# Patient Record
Sex: Male | Born: 1950
Health system: Southern US, Community
[De-identification: ages and names within clinical notes are randomized; demographics above are authoritative.]

## PROBLEM LIST (undated history)

## (undated) DIAGNOSIS — C801 Malignant (primary) neoplasm, unspecified: Secondary | ICD-10-CM

## (undated) DIAGNOSIS — M1990 Unspecified osteoarthritis, unspecified site: Secondary | ICD-10-CM

## (undated) HISTORY — PX: JOINT REPLACEMENT: SHX530

## (undated) HISTORY — DX: Unspecified osteoarthritis, unspecified site: M19.90

---

## 1997-06-26 ENCOUNTER — Encounter: Admission: RE | Admit: 1997-06-26 | Discharge: 1997-06-26 | Payer: Self-pay | Admitting: Sports Medicine

## 2001-08-27 ENCOUNTER — Encounter: Admission: RE | Admit: 2001-08-27 | Discharge: 2001-08-27 | Payer: Self-pay | Admitting: Sports Medicine

## 2001-11-12 ENCOUNTER — Encounter: Admission: RE | Admit: 2001-11-12 | Discharge: 2001-11-12 | Payer: Self-pay | Admitting: Sports Medicine

## 2004-11-21 ENCOUNTER — Ambulatory Visit: Payer: Self-pay | Admitting: Internal Medicine

## 2004-11-24 ENCOUNTER — Ambulatory Visit: Payer: Self-pay | Admitting: Internal Medicine

## 2004-12-26 ENCOUNTER — Ambulatory Visit: Payer: Self-pay | Admitting: Internal Medicine

## 2005-08-16 ENCOUNTER — Ambulatory Visit (HOSPITAL_COMMUNITY): Admission: RE | Admit: 2005-08-16 | Discharge: 2005-08-16 | Payer: Self-pay | Admitting: Orthopaedic Surgery

## 2005-08-18 ENCOUNTER — Encounter: Admission: RE | Admit: 2005-08-18 | Discharge: 2005-08-18 | Payer: Self-pay | Admitting: Orthopaedic Surgery

## 2005-09-08 ENCOUNTER — Ambulatory Visit: Payer: Self-pay | Admitting: Family Medicine

## 2005-09-19 ENCOUNTER — Ambulatory Visit: Payer: Self-pay | Admitting: Sports Medicine

## 2005-10-17 ENCOUNTER — Ambulatory Visit: Payer: Self-pay | Admitting: Sports Medicine

## 2005-11-21 ENCOUNTER — Ambulatory Visit: Payer: Self-pay | Admitting: Gastroenterology

## 2005-11-28 ENCOUNTER — Ambulatory Visit: Payer: Self-pay | Admitting: Sports Medicine

## 2005-11-29 ENCOUNTER — Ambulatory Visit: Payer: Self-pay | Admitting: Gastroenterology

## 2008-05-09 IMAGING — MR MR LUMBAR SPINE W/O CM
4 of 8 series · 19 of 48 positions shown · IV contrast (agent unspecified)
Comparison: There is 5 mm of anterior slip of L3 on L4.

CLINICAL DATA: Low back pain, right lumbar spasms.  
MRI LUMBAR SPINE WITHOUT CONTRAST:
TECHNIQUE: Multiplanar and multiecho pulse sequences of the lumbar spine, to include the lower thoracic and upper sacral regions, were obtained according to standard protocol without IV contrast.

[Series 3: T2 · sagittal · 4.0mm · 0.51mm/px · 4 of 12 slices shown (1 of 2)]
[im 1/12]
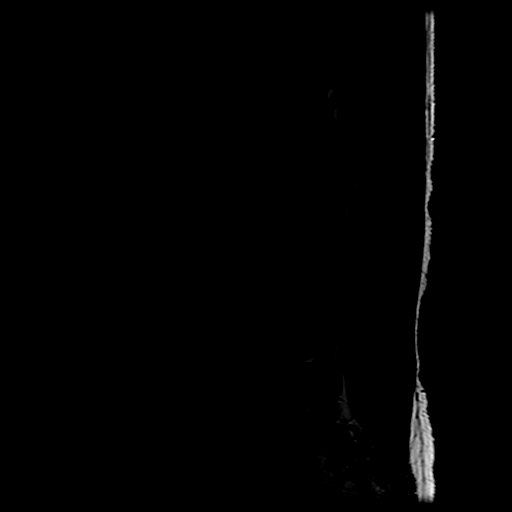
[im 4/12]
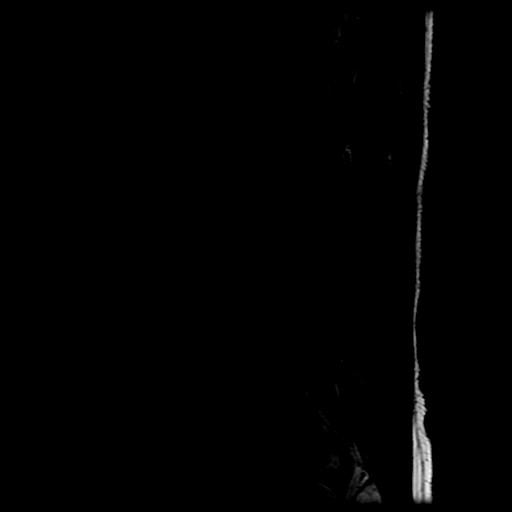
[im 8/12]
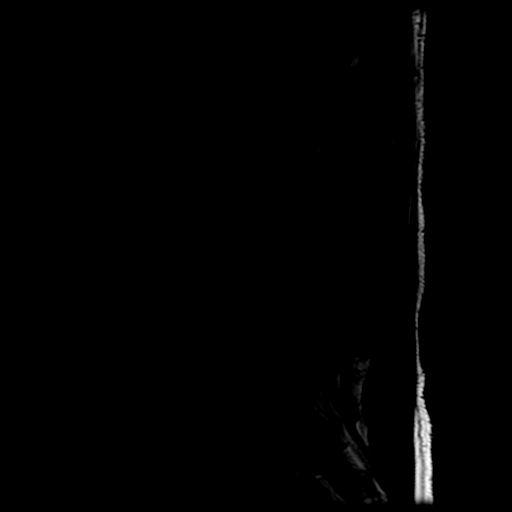
[im 12/12]
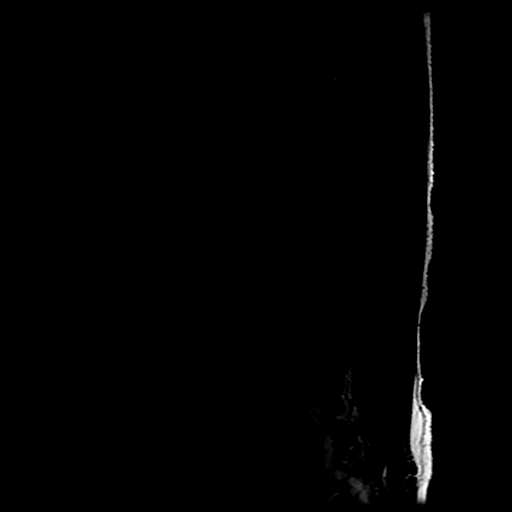

[Series 4: T1 · sagittal · 4.0mm · 0.51mm/px · 2 of 6 slices shown (1 of 2)]
[im 1/6]
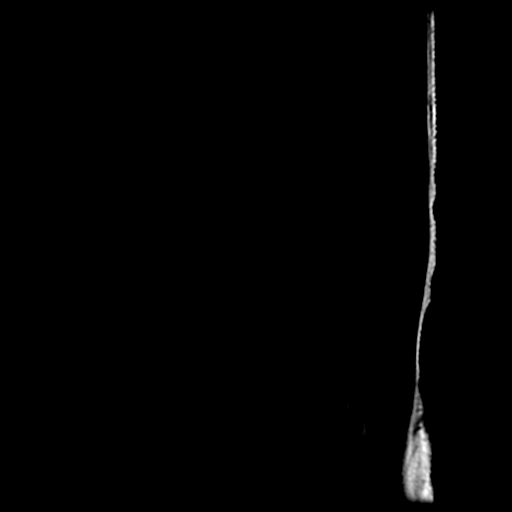
[im 6/6]
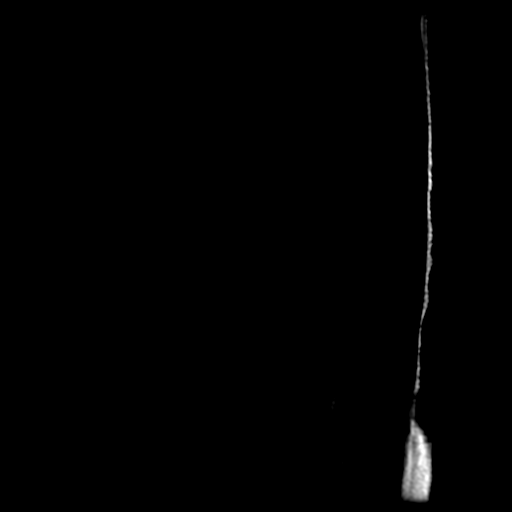

[Series 5: T1 · sagittal · 4.0mm · 0.51mm/px · 4 of 12 slices shown (2 of 2)]
[im 1/12]
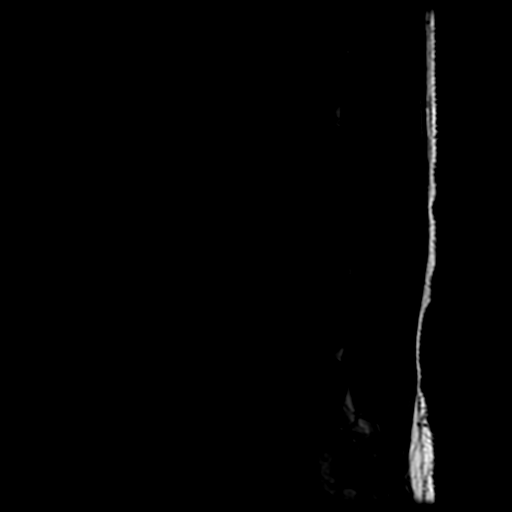
[im 4/12]
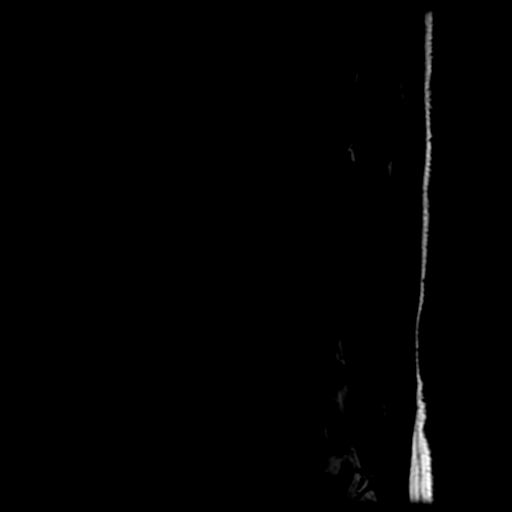
[im 8/12]
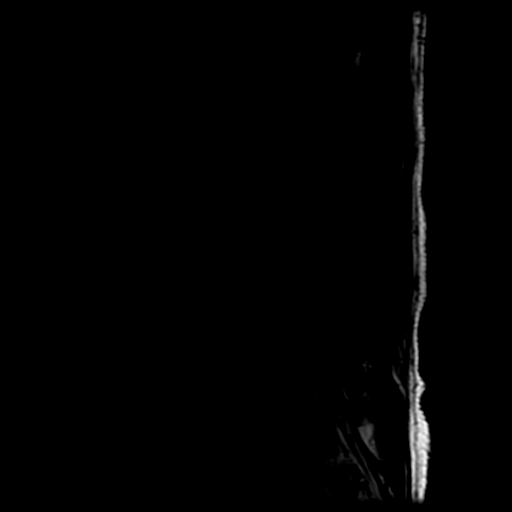
[im 12/12]
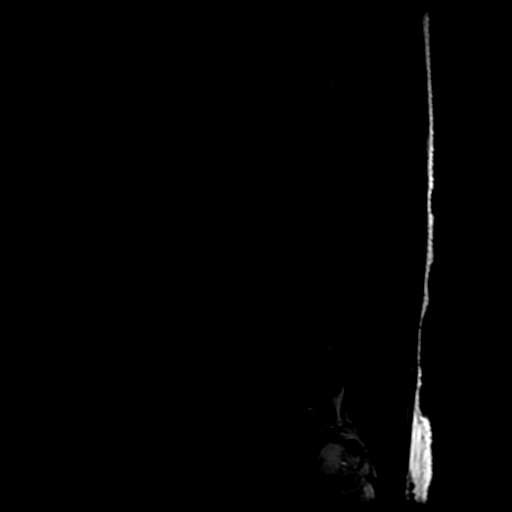

[Series 7: T2 · axial · 4.0mm · 0.35mm/px · z∈[-137,+49]mm · 9 of 24 slices shown (2 of 2)]
[im 1/24]
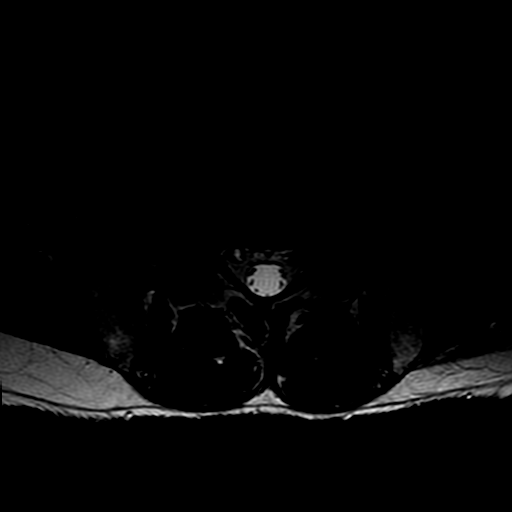
[im 3/24]
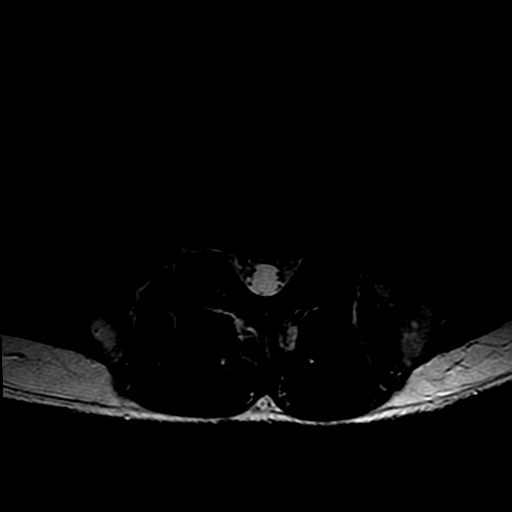
[im 6/24]
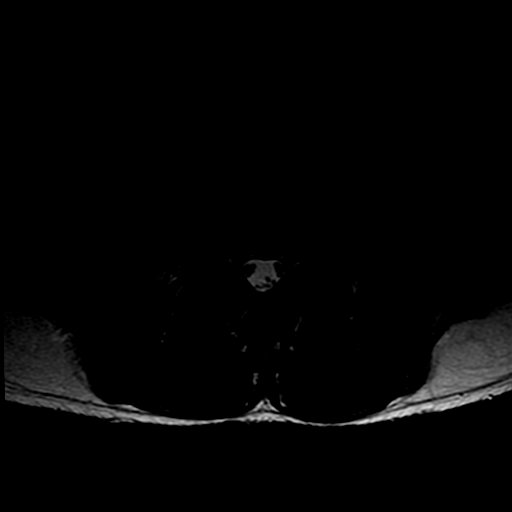
[im 9/24]
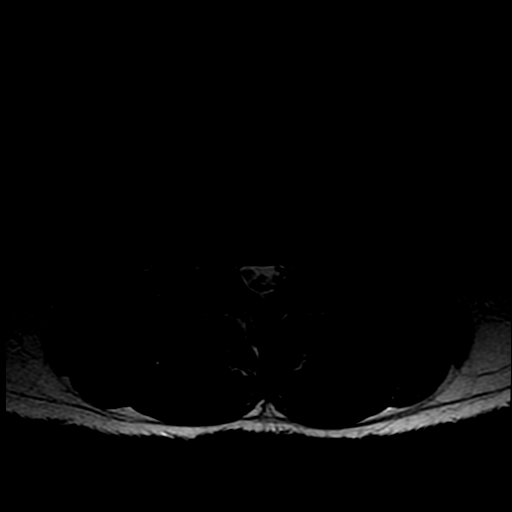
[im 12/24]
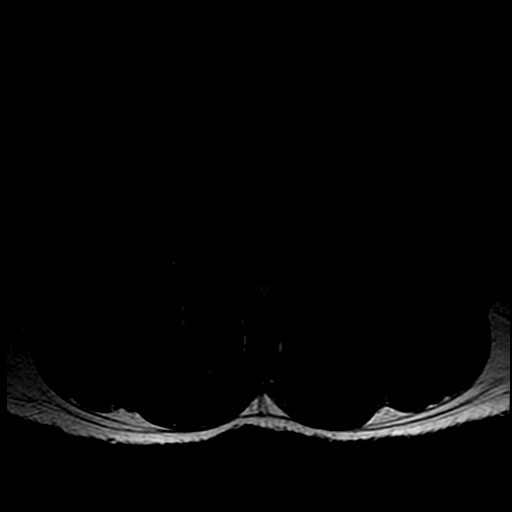
[im 15/24]
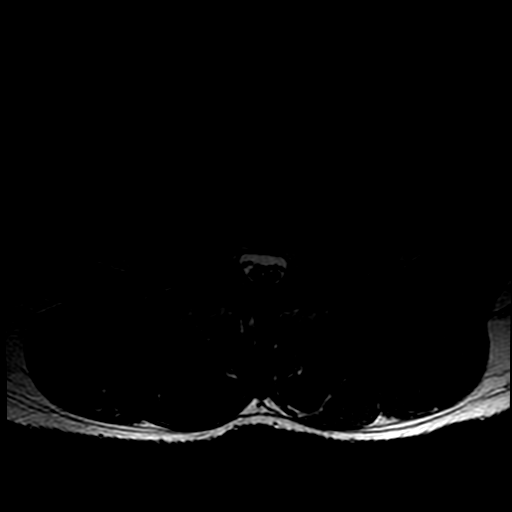
[im 18/24]
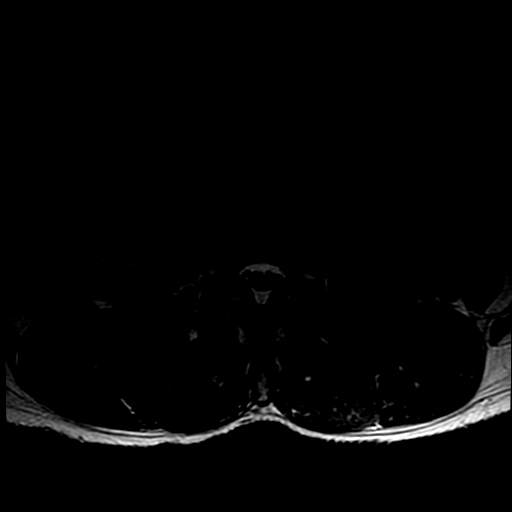
[im 21/24]
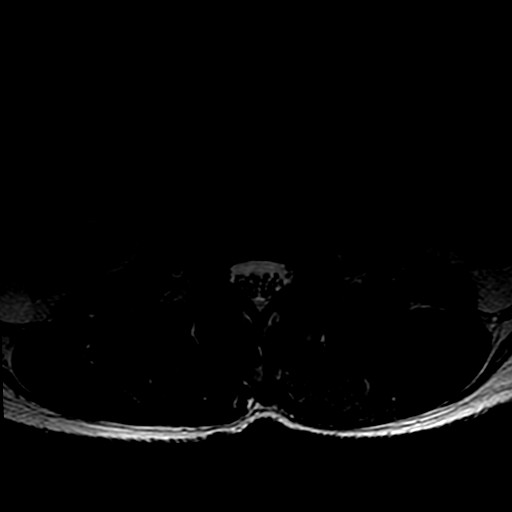
[im 24/24]
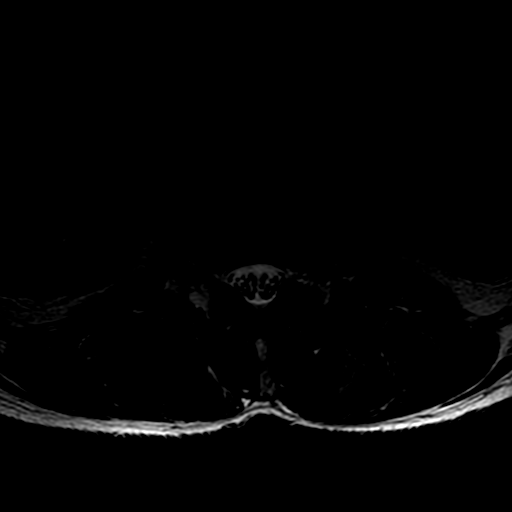

[19 of 48 positions shown; findings below may reference images not displayed]

The remainder of the alignment is normal.   The conus medullaris is normal and there is no fracture. 
L1-2:  Mild disc degeneration and mild facet disease. 
L2-3:  Shallow broad-based disc protrusion with mild facet arthropathy.  There is mild spinal stenosis. 
L3-4:  5 mm of anterior slip are present with disc degeneration and moderate facet arthropathy. There is moderate to severe central canal stenosis 
L4-5:  A shallow central disc protrusion.  There is bilateral facet arthropathy and mild spinal stenosis.  There is posterior vertebral spurring further contributing to spinal stenosis.  
L5-S1:  There is a very small left paracentral disc protrusion which is touching the left S1 nerve root and could cause a S1 radiculopathy.
IMPRESSION: There is 5 mm of anterior slip of L3 on L4 with facet arthropathy and moderate to severe spinal stenosis. 
There is broad-based disc protrusion and spondylosis at L4-5 with mild spinal stenosis. 
Small left paracentral disc protrusion L5-S1.

## 2008-11-27 ENCOUNTER — Encounter: Admission: RE | Admit: 2008-11-27 | Discharge: 2008-11-27 | Payer: Self-pay | Admitting: Sports Medicine

## 2008-11-27 ENCOUNTER — Ambulatory Visit: Payer: Self-pay | Admitting: Sports Medicine

## 2008-11-27 DIAGNOSIS — M545 Low back pain, unspecified: Secondary | ICD-10-CM | POA: Insufficient documentation

## 2008-11-27 DIAGNOSIS — M25559 Pain in unspecified hip: Secondary | ICD-10-CM | POA: Insufficient documentation

## 2010-03-20 ENCOUNTER — Encounter: Payer: Self-pay | Admitting: Orthopaedic Surgery

## 2011-02-28 HISTORY — PX: JOINT REPLACEMENT: SHX530

## 2011-03-02 ENCOUNTER — Encounter: Payer: Self-pay | Admitting: Internal Medicine

## 2011-03-02 ENCOUNTER — Ambulatory Visit (INDEPENDENT_AMBULATORY_CARE_PROVIDER_SITE_OTHER): Payer: Commercial Managed Care - PPO | Admitting: Internal Medicine

## 2011-03-02 VITALS — BP 130/70 | HR 54 | Temp 98.4°F | Resp 16 | Ht 70.0 in | Wt 174.2 lb

## 2011-03-02 DIAGNOSIS — Z0000 Encounter for general adult medical examination without abnormal findings: Secondary | ICD-10-CM | POA: Insufficient documentation

## 2011-03-02 DIAGNOSIS — Z136 Encounter for screening for cardiovascular disorders: Secondary | ICD-10-CM

## 2011-03-02 DIAGNOSIS — Z23 Encounter for immunization: Secondary | ICD-10-CM

## 2011-03-02 NOTE — Patient Instructions (Signed)
Health Maintenance, Males A healthy lifestyle and preventative care can promote health and wellness.  Maintain regular health, dental, and eye exams.   Eat a healthy diet. Foods like vegetables, fruits, whole grains, low-fat dairy products, and lean protein foods contain the nutrients you need without too many calories. Decrease your intake of foods high in solid fats, added sugars, and salt. Get information about a proper diet from your caregiver, if necessary.   Regular physical exercise is one of the most important things you can do for your health. Most adults should get at least 150 minutes of moderate-intensity exercise (any activity that increases your heart rate and causes you to sweat) each week. In addition, most adults need muscle-strengthening exercises on 2 or more days a week.    Maintain a healthy weight. The body mass index (BMI) is a screening tool to identify possible weight problems. It provides an estimate of body fat based on height and weight. Your caregiver can help determine your BMI, and can help you achieve or maintain a healthy weight. For adults 20 years and older:   A BMI below 18.5 is considered underweight.   A BMI of 18.5 to 24.9 is normal.   A BMI of 25 to 29.9 is considered overweight.   A BMI of 30 and above is considered obese.   Maintain normal blood lipids and cholesterol by exercising and minimizing your intake of saturated fat. Eat a balanced diet with plenty of fruits and vegetables. Blood tests for lipids and cholesterol should begin at age 20 and be repeated every 5 years. If your lipid or cholesterol levels are high, you are over 50, or you are a high risk for heart disease, you may need your cholesterol levels checked more frequently.Ongoing high lipid and cholesterol levels should be treated with medicines, if diet and exercise are not effective.   If you smoke, find out from your caregiver how to quit. If you do not use tobacco, do not start.    If you choose to drink alcohol, do not exceed 2 drinks per day. One drink is considered to be 12 ounces (355 mL) of beer, 5 ounces (148 mL) of wine, or 1.5 ounces (44 mL) of liquor.   Avoid use of street drugs. Do not share needles with anyone. Ask for help if you need support or instructions about stopping the use of drugs.   High blood pressure causes heart disease and increases the risk of stroke. Blood pressure should be checked at least every 1 to 2 years. Ongoing high blood pressure should be treated with medicines if weight loss and exercise are not effective.   If you are 45 to 61 years old, ask your caregiver if you should take aspirin to prevent heart disease.   Diabetes screening involves taking a blood sample to check your fasting blood sugar level. This should be done once every 3 years, after age 45, if you are within normal weight and without risk factors for diabetes. Testing should be considered at a younger age or be carried out more frequently if you are overweight and have at least 1 risk factor for diabetes.   Colorectal cancer can be detected and often prevented. Most routine colorectal cancer screening begins at the age of 50 and continues through age 75. However, your caregiver may recommend screening at an earlier age if you have risk factors for colon cancer. On a yearly basis, your caregiver may provide home test kits to check for hidden   blood in the stool. Use of a small camera at the end of a tube, to directly examine the colon (sigmoidoscopy or colonoscopy), can detect the earliest forms of colorectal cancer. Talk to your caregiver about this at age 50, when routine screening begins. Direct examination of the colon should be repeated every 5 to 10 years through age 75, unless early forms of pre-cancerous polyps or small growths are found.   Healthy men should no longer receive prostate-specific antigen (PSA) blood tests as part of routine cancer screening. Consult with  your caregiver about prostate cancer screening.   Practice safe sex. Use condoms and avoid high-risk sexual practices to reduce the spread of sexually transmitted infections (STIs).   Use sunscreen with a sun protection factor (SPF) of 30 or greater. Apply sunscreen liberally and repeatedly throughout the day. You should seek shade when your shadow is shorter than you. Protect yourself by wearing long sleeves, pants, a wide-brimmed hat, and sunglasses year round, whenever you are outdoors.   Notify your caregiver of new moles or changes in moles, especially if there is a change in shape or color. Also notify your caregiver if a mole is larger than the size of a pencil eraser.   A one-time screening for abdominal aortic aneurysm (AAA) and surgical repair of large AAAs by sound wave imaging (ultrasonography) is recommended for ages 65 to 75 years who are current or former smokers.   Stay current with your immunizations.  Document Released: 08/12/2007 Document Revised: 10/26/2010 Document Reviewed: 07/11/2010 [employer] Patient Information 2012 [employer]. 

## 2011-03-02 NOTE — Progress Notes (Signed)
  Subjective:    Patient ID: Joseph Fletcher, male    DOB: 12/28/1950, 61 y.o.   MRN: [id number]  HPI  New to me for a complete physical, he offers no complaints today.  Review of Systems  Constitutional: Negative.   HENT: Negative.   Eyes: Negative.   Respiratory: Negative.   Cardiovascular: Negative.   Gastrointestinal: Negative.   Genitourinary: Negative.   Musculoskeletal: Negative.   Skin: Negative.   Neurological: Negative.   Hematological: Negative.   Psychiatric/Behavioral: Negative.        Objective:   Physical Exam  Vitals reviewed. Constitutional: He is oriented to person, place, and time. He appears well-developed and well-nourished. No distress.  HENT:  Head: Normocephalic and atraumatic.  Mouth/Throat: Oropharynx is clear and moist. No oropharyngeal exudate.  Eyes: Conjunctivae are normal. Right eye exhibits no discharge. Left eye exhibits no discharge. No scleral icterus.  Neck: Normal range of motion. Neck supple. No JVD present. No tracheal deviation present. No thyromegaly present.  Cardiovascular: Normal rate, regular rhythm, normal heart sounds and intact distal pulses.  Exam reveals no gallop and no friction rub.   No murmur heard. Pulmonary/Chest: Effort normal and breath sounds normal. No stridor. No respiratory distress. He has no wheezes. He has no rales. He exhibits no tenderness.  Abdominal: Soft. Bowel sounds are normal. He exhibits no distension and no mass. There is no tenderness. There is no rebound and no guarding. Hernia confirmed negative in the right inguinal area and confirmed negative in the left inguinal area.  Genitourinary: Rectum normal, testes normal and penis normal. Rectal exam shows no external hemorrhoid, no internal hemorrhoid, no fissure, no mass, no tenderness and anal tone normal. Guaiac negative stool. Prostate is enlarged (1+ smooth bilateral hypertrophy). Prostate is not tender. Right testis shows no mass, no swelling and no  tenderness. Right testis is descended. Left testis shows no mass, no swelling and no tenderness. Left testis is descended. Circumcised. No penile tenderness. No discharge found.  Musculoskeletal: Normal range of motion. He exhibits no edema and no tenderness.  Lymphadenopathy:    He has no cervical adenopathy.       Right: No inguinal adenopathy present.       Left: No inguinal adenopathy present.  Neurological: He is oriented to person, place, and time.  Skin: Skin is warm and dry. No rash noted. He is not diaphoretic. No erythema. No pallor.  Psychiatric: He has a normal mood and affect. His behavior is normal. Judgment and thought content normal.          Assessment & Plan:

## 2011-03-02 NOTE — Assessment & Plan Note (Signed)
EKG is normal. Exam done, labs ordered, vaccines updated, pt. Ed. material was given

## 2011-03-03 ENCOUNTER — Other Ambulatory Visit (INDEPENDENT_AMBULATORY_CARE_PROVIDER_SITE_OTHER): Payer: Commercial Managed Care - PPO

## 2011-03-03 ENCOUNTER — Encounter: Payer: Self-pay | Admitting: Internal Medicine

## 2011-03-03 DIAGNOSIS — Z0000 Encounter for general adult medical examination without abnormal findings: Secondary | ICD-10-CM

## 2011-03-03 LAB — URINALYSIS, ROUTINE W REFLEX MICROSCOPIC
Ketones, ur: NEGATIVE
Leukocytes, UA: NEGATIVE
Nitrite: NEGATIVE
Total Protein, Urine: NEGATIVE

## 2011-03-03 LAB — CBC WITH DIFFERENTIAL/PLATELET
Basophils Absolute: 0 10*3/uL (ref 0.0–0.1)
Eosinophils Relative: 3.3 % (ref 0.0–5.0)
HCT: 44.4 % (ref 39.0–52.0)
Hemoglobin: 15.1 g/dL (ref 13.0–17.0)
Lymphs Abs: 1.5 10*3/uL (ref 0.7–4.0)
MCV: 96.7 fl (ref 78.0–100.0)
Monocytes Relative: 6.9 % (ref 3.0–12.0)
Neutro Abs: 2.2 10*3/uL (ref 1.4–7.7)
Neutrophils Relative %: 53.3 % (ref 43.0–77.0)
WBC: 4.1 10*3/uL — ABNORMAL LOW (ref 4.5–10.5)

## 2011-03-03 LAB — COMPREHENSIVE METABOLIC PANEL
AST: 20 U/L (ref 0–37)
Albumin: 4.4 g/dL (ref 3.5–5.2)
BUN: 18 mg/dL (ref 6–23)
Glucose, Bld: 92 mg/dL (ref 70–99)
Sodium: 141 mEq/L (ref 135–145)
Total Bilirubin: 1.2 mg/dL (ref 0.3–1.2)

## 2011-03-03 LAB — TSH: TSH: 1.09 u[IU]/mL (ref 0.35–5.50)

## 2011-03-03 LAB — PSA: PSA: 1.63 ng/mL (ref 0.10–4.00)

## 2011-03-03 NOTE — Progress Notes (Signed)
Addended by: Etta Grandchild on: 03/03/2011 12:02 PM   Modules accepted: Orders

## 2011-08-21 ENCOUNTER — Encounter: Payer: Self-pay | Admitting: Sports Medicine

## 2011-08-21 ENCOUNTER — Ambulatory Visit (INDEPENDENT_AMBULATORY_CARE_PROVIDER_SITE_OTHER): Payer: Commercial Managed Care - PPO | Admitting: Sports Medicine

## 2011-08-21 VITALS — BP 143/79 | HR 59 | Ht 69.5 in | Wt 170.0 lb

## 2011-08-21 DIAGNOSIS — M1610 Unilateral primary osteoarthritis, unspecified hip: Secondary | ICD-10-CM

## 2011-08-21 DIAGNOSIS — M160 Bilateral primary osteoarthritis of hip: Secondary | ICD-10-CM

## 2011-08-21 DIAGNOSIS — M7660 Achilles tendinitis, unspecified leg: Secondary | ICD-10-CM

## 2011-08-21 DIAGNOSIS — M169 Osteoarthritis of hip, unspecified: Secondary | ICD-10-CM | POA: Insufficient documentation

## 2011-08-21 IMAGING — CR DG PELVIS 1-2V
1 series · 1 of 1 positions shown · non-contrast
Comparison: None

CLINICAL DATA: Right hip and pelvis pain.

PELVIS - 1-2 VIEW

[t pelvis a.p.]
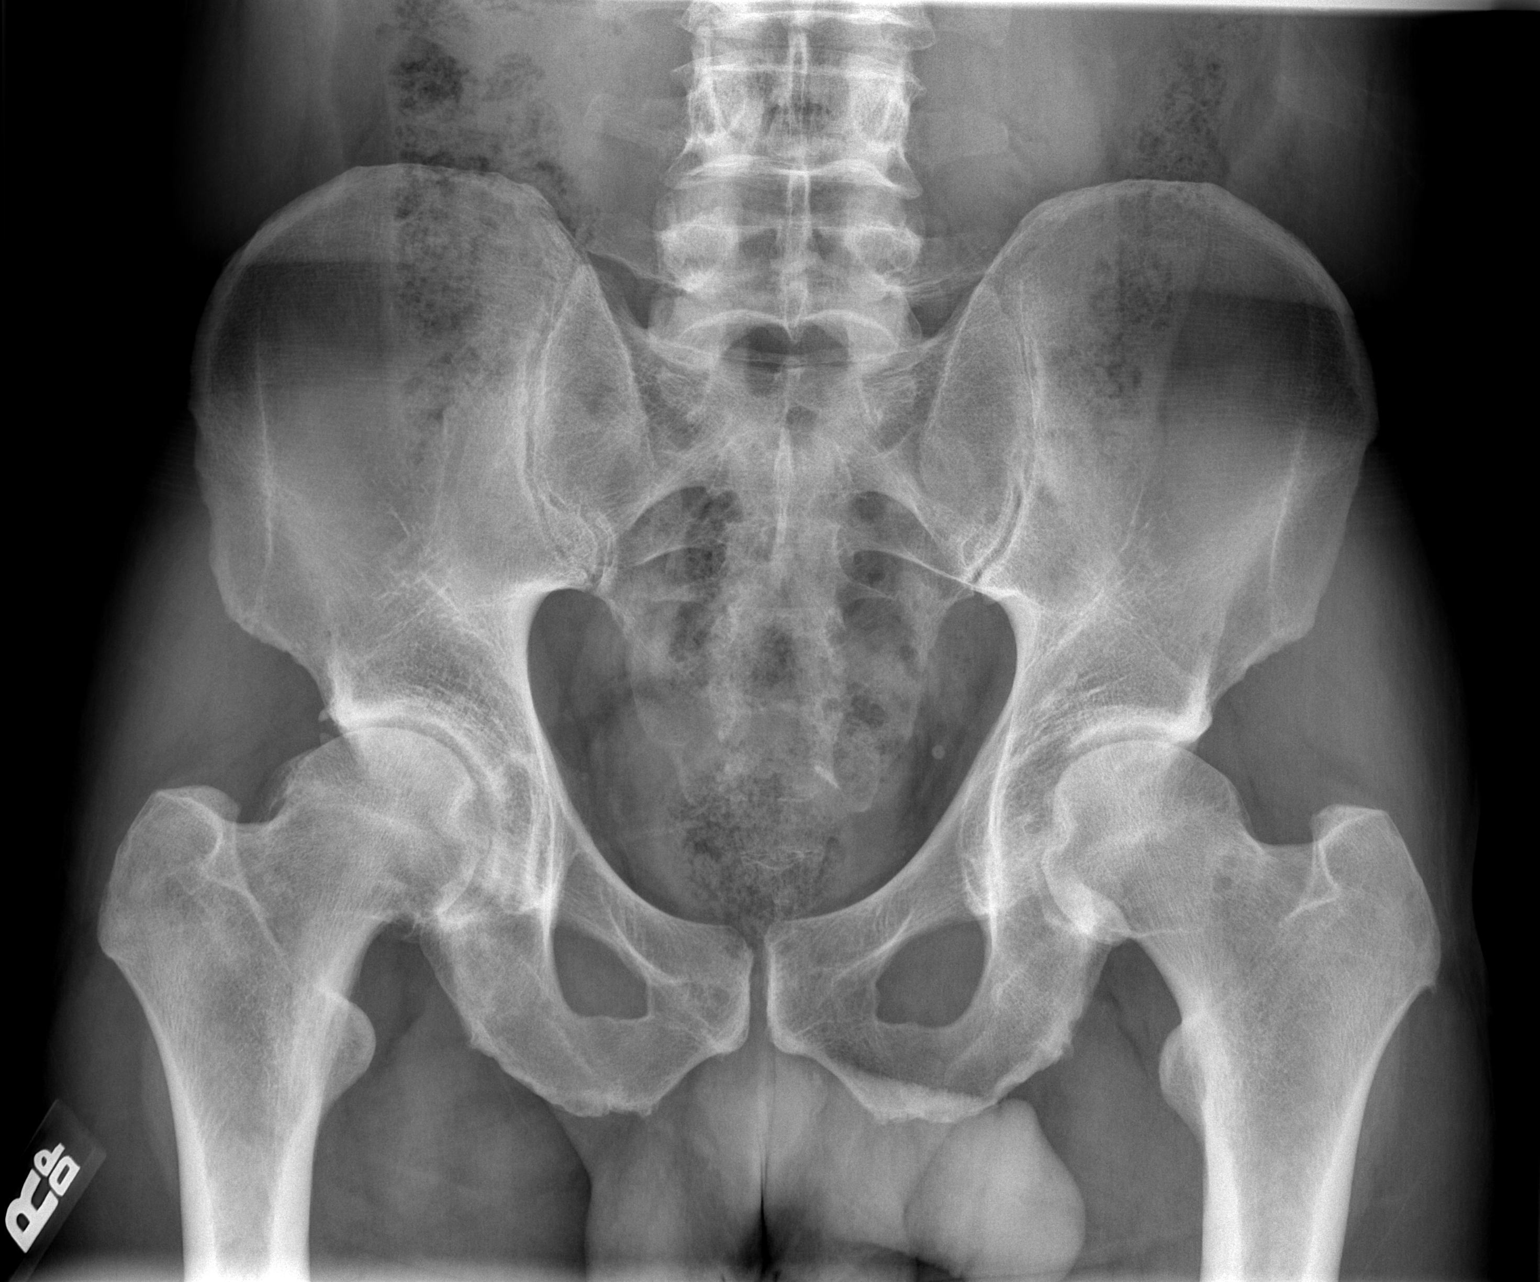

[1 of 1 positions shown; findings below may reference images not displayed]

FINDINGS: Degenerative OA of the right hip.  Subarticular sclerosis
and osteophytic formation.  Mild narrowing of the lateral aspect of
the superior joint space.  SI joints unremarkable.  Left hip joint
unremarkable.
IMPRESSION: Degenerative OA of the right hip.

## 2011-08-21 IMAGING — CR DG HIP COMPLETE 2+V*R*
2 series · 2 of 2 positions shown · non-contrast
Comparison: None

CLINICAL DATA: Right hip pain for several months.

RIGHT HIP - COMPLETE 2+ VIEW

[t hip ap right]
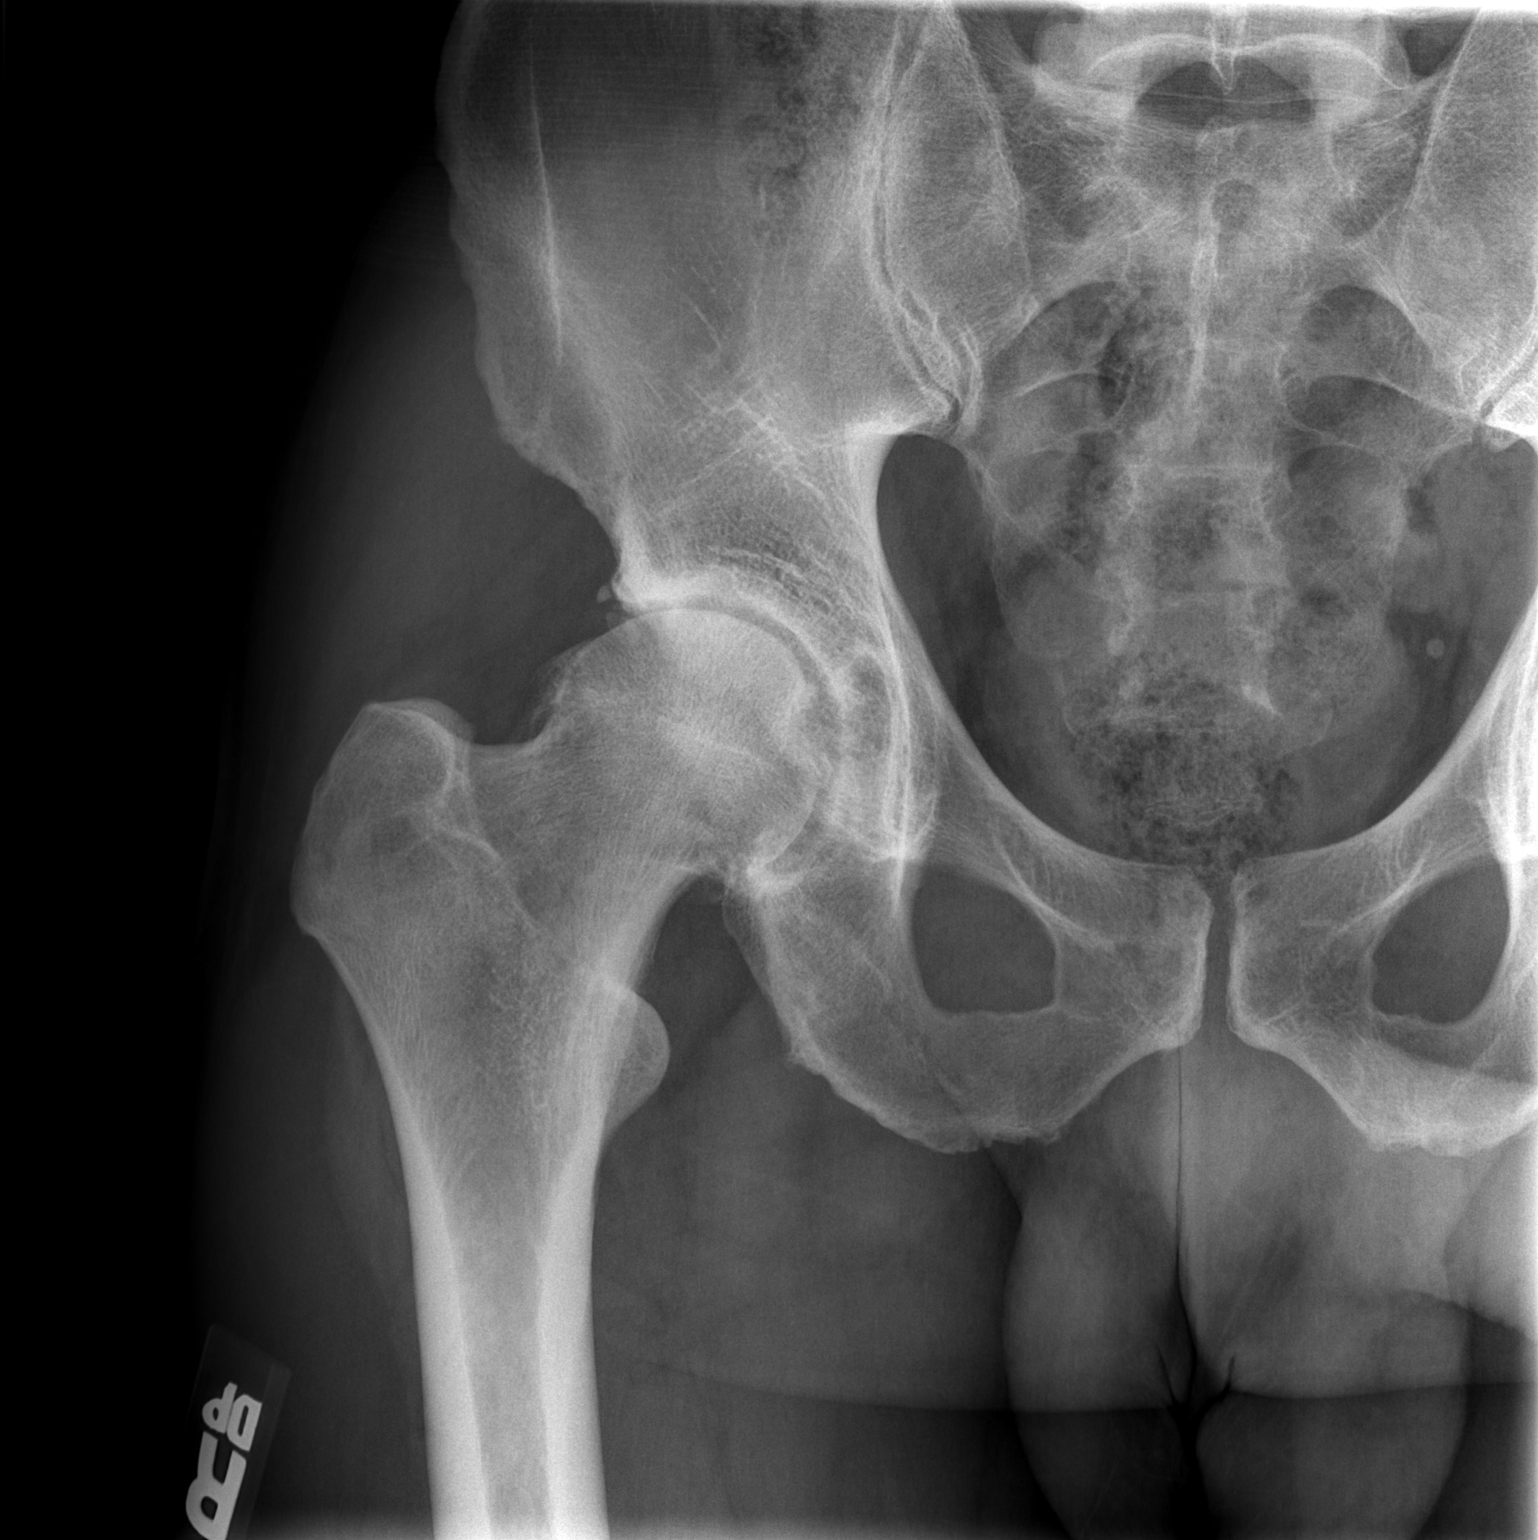

[t hip frog leg right]
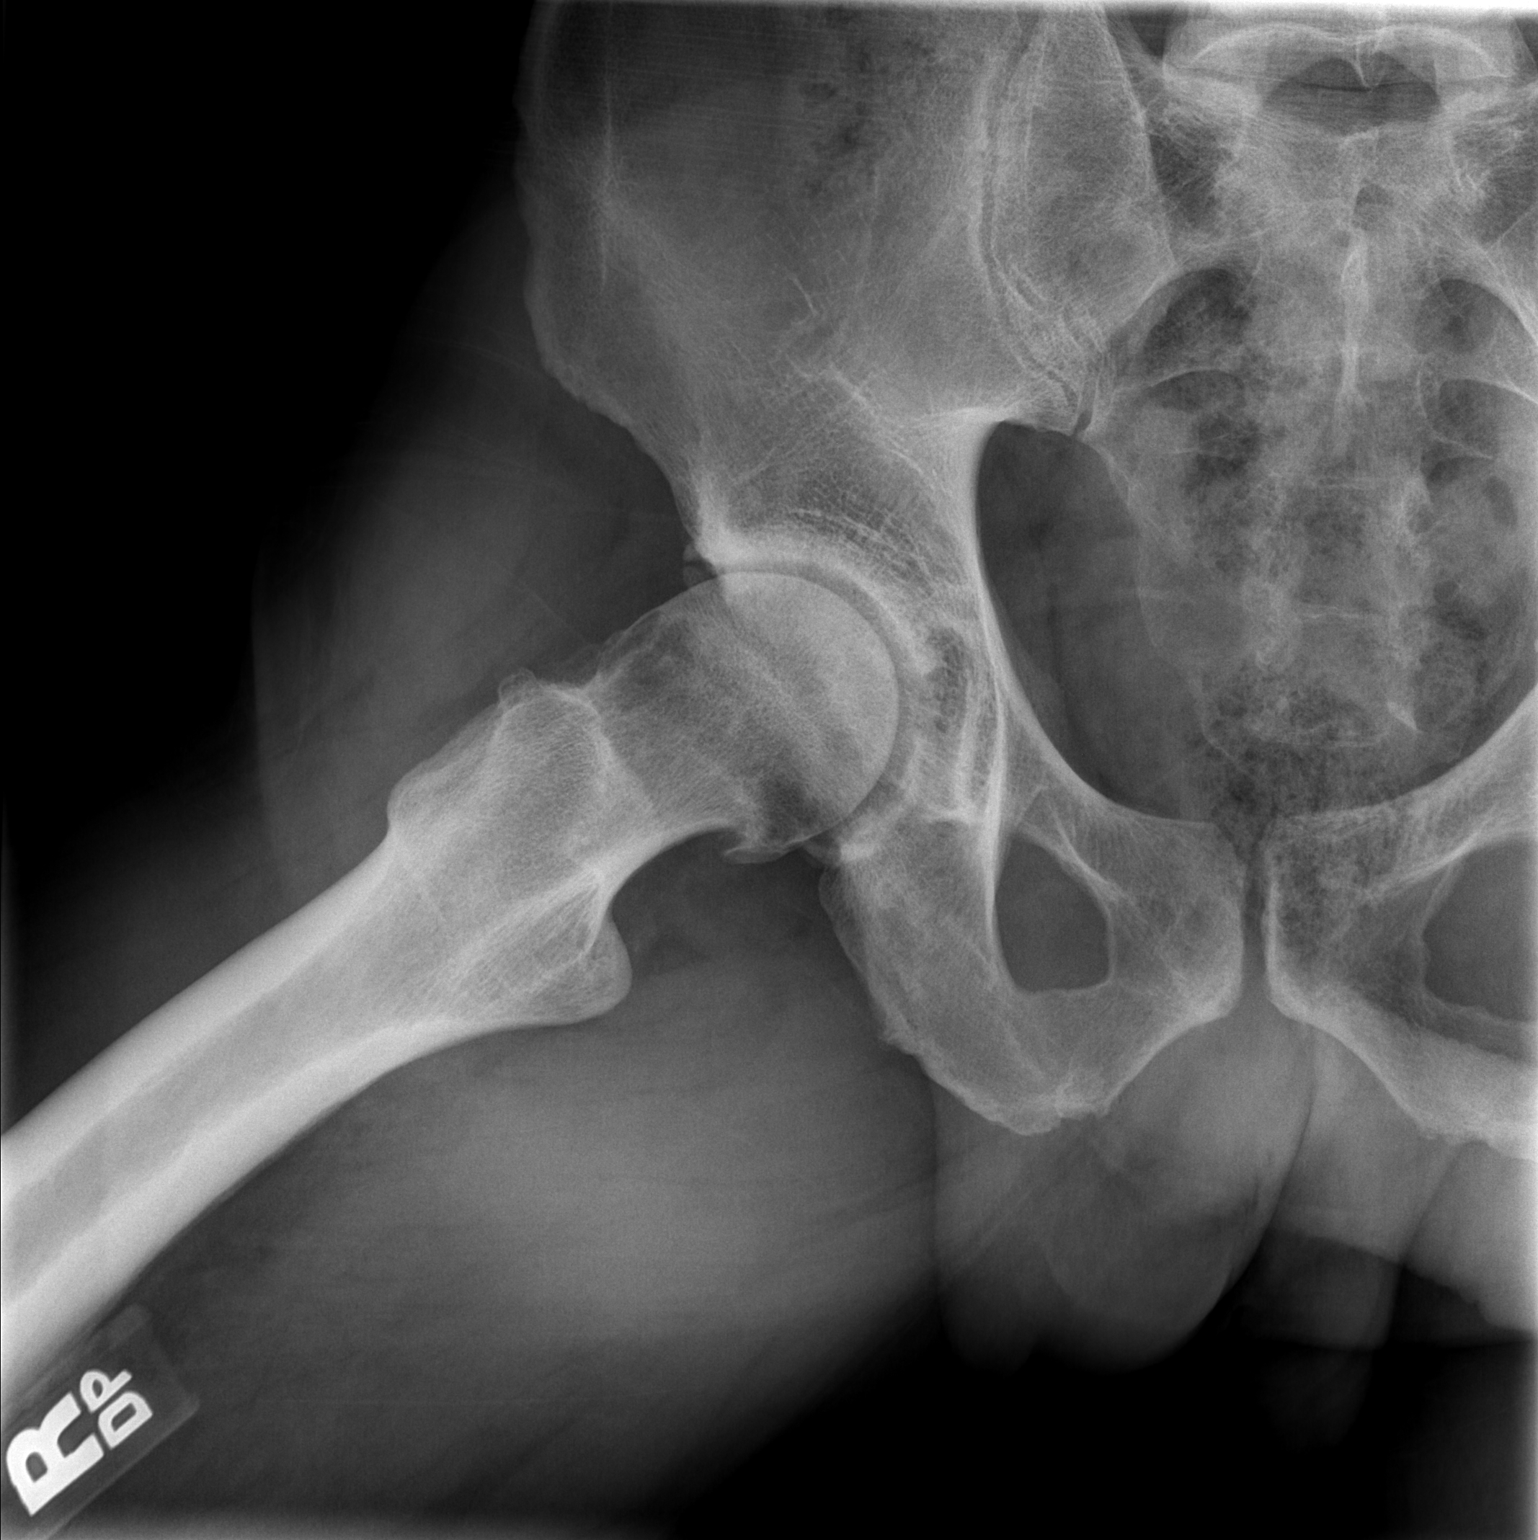

[2 of 2 positions shown; findings below may reference images not displayed]

FINDINGS: Degenerative OA changes.  Subarticular sclerosis and
osteophytic formation.  Mild narrowing of the superior lateral
joint space.
IMPRESSION: Degenerative OA.

## 2011-08-21 MED ORDER — NITROGLYCERIN 0.2 MG/HR TD PT24: MEDICATED_PATCH | TRANSDERMAL | Status: DC

## 2011-08-21 NOTE — Progress Notes (Signed)
Patient ID: Joseph Fletcher, male   DOB: August 26, 1950, 61 y.o.   MRN: [id number]  HPI:  Right heel pain-- worsened 2 months ago, without particular injury. Came up after he played in tournament two matches Friday and Sunday which is more than he usually does.  Felt like he had some difficulties walking at that time.  Pain was worse after finishing tennis. Last few weeks pain has become stinging in addition to achy pain, after tennis tournament.  Has stress fx 10 years ago, with mild ache since that time.  Pain is lasting for longer periods of time-- has not played tennis since last Tues. + swelling over 1-2 weeks.  Takes motrin for hip. Hasn't tried anything for heel.  Exam  RT heel shows mild swelling and TTP at medial distal insertion of AT Main AT is mildly thickened but not TTP or swollen No TTP on calcaneal squeeze PF is non tender  Hip ROM RT shows only 5 deg IR and 20 deg ER seated Lt shows 15 deg IR and 30 deg ER  MSK [place] RT AT is mildly thickened at 0.56 at insertion There is some hypoechoic change at insertion There is calcification at distal insertion medially Marked increased doppler flow along insertion of tendon

## 2011-08-21 NOTE — Assessment & Plan Note (Signed)
Start HEP with eccentric heel raises  Start NTG protocol  Ice post exercise  Heel lifts for shoes  Can try compression if he wishes  Reck in 4 to 6 weeks

## 2011-08-21 NOTE — Assessment & Plan Note (Signed)
Continue to work on ER on RT and both IR and ER on left

## 2011-09-05 ENCOUNTER — Other Ambulatory Visit: Payer: Self-pay | Admitting: *Deleted

## 2011-09-05 MED ORDER — GABAPENTIN 300 MG PO CAPS: 300.0000 mg | ORAL_CAPSULE | Freq: Three times a day (TID) | ORAL | Status: DC

## 2011-09-05 MED ORDER — TRAMADOL HCL 50 MG PO TABS: 50.0000 mg | ORAL_TABLET | Freq: Four times a day (QID) | ORAL | Status: AC | PRN

## 2011-09-05 NOTE — Progress Notes (Signed)
Message     Joseph Fletcher,      This was probably prescribed while he was in centricity.   Dose:   Tramadol 50 mgm q 6hrs prn # 100 refill 6 mos   Gabapentin - 300 tid # 90 RF 6 mos      tks   ----- Message -----   From: Claiborne Billings   Sent: 09/04/2011 4:48 PM   To: Enid Baas, MD      EPIC does not list Tramadol or Neurotin. Please send dose and timing of each to [name] or [place]. Patient uses [facility] and will pick up after lunch on Tuesday.               Forwarded by:     Enid Baas Date: 09/05/2011

## 2011-09-13 ENCOUNTER — Ambulatory Visit: Payer: Commercial Managed Care - PPO | Admitting: Sports Medicine

## 2012-07-15 ENCOUNTER — Ambulatory Visit (INDEPENDENT_AMBULATORY_CARE_PROVIDER_SITE_OTHER): Payer: Commercial Managed Care - PPO | Admitting: Psychology

## 2012-07-15 DIAGNOSIS — F4320 Adjustment disorder, unspecified: Secondary | ICD-10-CM

## 2012-07-30 ENCOUNTER — Ambulatory Visit (INDEPENDENT_AMBULATORY_CARE_PROVIDER_SITE_OTHER): Payer: Commercial Managed Care - PPO | Admitting: Psychology

## 2012-07-30 DIAGNOSIS — F4320 Adjustment disorder, unspecified: Secondary | ICD-10-CM

## 2012-08-08 ENCOUNTER — Ambulatory Visit: Payer: Commercial Managed Care - PPO | Admitting: Psychology

## 2012-12-10 ENCOUNTER — Ambulatory Visit (INDEPENDENT_AMBULATORY_CARE_PROVIDER_SITE_OTHER): Payer: Commercial Managed Care - PPO | Admitting: Sports Medicine

## 2012-12-10 VITALS — BP 129/79 | Ht 70.0 in | Wt 172.0 lb

## 2012-12-10 DIAGNOSIS — M25562 Pain in left knee: Secondary | ICD-10-CM | POA: Insufficient documentation

## 2012-12-10 DIAGNOSIS — M25569 Pain in unspecified knee: Secondary | ICD-10-CM

## 2012-12-10 DIAGNOSIS — M23302 Other meniscus derangements, unspecified lateral meniscus, unspecified knee: Secondary | ICD-10-CM

## 2012-12-10 DIAGNOSIS — M23309 Other meniscus derangements, unspecified meniscus, unspecified knee: Secondary | ICD-10-CM | POA: Insufficient documentation

## 2012-12-10 DIAGNOSIS — M23307 Other meniscus derangements, unspecified meniscus, left knee: Secondary | ICD-10-CM

## 2012-12-10 NOTE — Assessment & Plan Note (Signed)
Will use OTC NSAIDs  Consider tramadol if worse pain

## 2012-12-10 NOTE — Patient Instructions (Signed)
For the meniscal tear:  - 2 weeks of standing and hitting with tennis. No running or playing - you can bike as a strengthening tool - avoid golfing for 1 month - use the body helix throughout the day

## 2012-12-10 NOTE — Assessment & Plan Note (Signed)
See plan  We will reevaluate in 1 mo

## 2012-12-10 NOTE — Progress Notes (Signed)
  Subjective:    Patient ID: Joseph Fletcher, male    DOB: Sep 17, 1950, 62 y.o.   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  He has had some achiness in the knee starting this summer for which he used body helix compression while playing tennis and golf. This was not severe or debilitating. Two weeks ago, after playing a match, he had exacerbation of pain in the medial aspect of his left knee. No recall of injury or twisting motion. He was unable to play tennis after this. Last Saturday, he experienced sharp shooting pain, with start of locking feeling in the medial aspect of the left knee, while walking. Pain subsided after a few minutes. No injury to the knee at the time. He denies any substantial swelling.  He has been playing golf and using the eliptical. He states that the twisting motion he does when swinging in golf makes the pain worst. Gofing twisting motion is worst than with tennis.   Review of Systems Negative except per HPI    Objective:   Physical Exam General: pleasant gentleman, in no acute distress Knee:  Inspection showing some soft tissue swelling along medial superior aspect of patella Palpation with no warmth or patellar tenderness or condyle tenderness. Some mild tenderness on palpation of medial joint ROM normal in flexion and extension and lower leg rotation.  Ligaments with solid consistent endpoints including ACL, PCL, LCL, MCL.  Some discomfort with McMurray's Non painful patellar compression.  Patellar and quadriceps tendons unremarkable.  quadriceps strength is normal.  5/5 hip abduction and adduction and flexion bilaterally  Musculoskeletal [place]: Suprapatellar effusion on left. None on right Tear of the medial meniscus on left. Meniscus intact on lateral side.  Calcified fragement extruded from joint line of medial knee Mild swelling along medial joint line    Assessment & Plan:  62 yo male with  left knee pain from medial meniscal tear with suprapatellar effusion on ultrasound. - no running with tennis. Ok to do stand and hits with tennis. No golfing for 1 month - focus on biking instead of elliptical - compression sleeve daily to help with suprapatellar effusion - follow up as needed  Marena Chancy, PGY-3 Family Medicine Resident

## 2012-12-31 ENCOUNTER — Ambulatory Visit: Payer: Commercial Managed Care - PPO | Admitting: Sports Medicine

## 2013-12-03 ENCOUNTER — Ambulatory Visit (INDEPENDENT_AMBULATORY_CARE_PROVIDER_SITE_OTHER): Payer: Commercial Managed Care - PPO | Admitting: Psychology

## 2013-12-03 DIAGNOSIS — F348 Other persistent mood [affective] disorders: Secondary | ICD-10-CM

## 2013-12-29 ENCOUNTER — Ambulatory Visit (INDEPENDENT_AMBULATORY_CARE_PROVIDER_SITE_OTHER): Payer: Commercial Managed Care - PPO | Admitting: Psychology

## 2013-12-29 DIAGNOSIS — F348 Other persistent mood [affective] disorders: Secondary | ICD-10-CM

## 2014-01-26 ENCOUNTER — Ambulatory Visit (INDEPENDENT_AMBULATORY_CARE_PROVIDER_SITE_OTHER): Payer: Commercial Managed Care - PPO | Admitting: Psychology

## 2014-01-26 DIAGNOSIS — F348 Other persistent mood [affective] disorders: Secondary | ICD-10-CM

## 2015-10-20 ENCOUNTER — Encounter: Payer: Self-pay | Admitting: Gastroenterology

## 2015-12-06 ENCOUNTER — Ambulatory Visit: Payer: Self-pay | Admitting: Orthopedic Surgery

## 2016-01-11 ENCOUNTER — Encounter (HOSPITAL_COMMUNITY): Payer: Self-pay

## 2016-01-13 ENCOUNTER — Encounter (HOSPITAL_COMMUNITY): Payer: Self-pay

## 2016-01-13 ENCOUNTER — Encounter (HOSPITAL_COMMUNITY)
Admission: RE | Admit: 2016-01-13 | Discharge: 2016-01-13 | Disposition: A | Discharge status: Home / Self Care | Payer: Commercial Managed Care - PPO | Source: Ambulatory Visit | Attending: Orthopedic Surgery | Admitting: Orthopedic Surgery

## 2016-01-13 DIAGNOSIS — M7660 Achilles tendinitis, unspecified leg: Secondary | ICD-10-CM | POA: Diagnosis not present

## 2016-01-13 DIAGNOSIS — Z01812 Encounter for preprocedural laboratory examination: Secondary | ICD-10-CM | POA: Diagnosis not present

## 2016-01-13 DIAGNOSIS — M23309 Other meniscus derangements, unspecified meniscus, unspecified knee: Secondary | ICD-10-CM | POA: Diagnosis not present

## 2016-01-13 DIAGNOSIS — M25562 Pain in left knee: Secondary | ICD-10-CM | POA: Insufficient documentation

## 2016-01-13 DIAGNOSIS — M160 Bilateral primary osteoarthritis of hip: Secondary | ICD-10-CM | POA: Insufficient documentation

## 2016-01-13 DIAGNOSIS — M25559 Pain in unspecified hip: Secondary | ICD-10-CM | POA: Insufficient documentation

## 2016-01-13 DIAGNOSIS — Z85828 Personal history of other malignant neoplasm of skin: Secondary | ICD-10-CM | POA: Insufficient documentation

## 2016-01-13 DIAGNOSIS — M545 Low back pain: Secondary | ICD-10-CM | POA: Insufficient documentation

## 2016-01-13 HISTORY — DX: Malignant (primary) neoplasm, unspecified: C80.1

## 2016-01-13 LAB — COMPREHENSIVE METABOLIC PANEL
ALBUMIN: 4.4 g/dL (ref 3.5–5.0)
ALK PHOS: 58 U/L (ref 38–126)
ALT: 21 U/L (ref 17–63)
AST: 22 U/L (ref 15–41)
Anion gap: 8 (ref 5–15)
BILIRUBIN TOTAL: 1.3 mg/dL — AB (ref 0.3–1.2)
BUN: 22 mg/dL — AB (ref 6–20)
CALCIUM: 9.3 mg/dL (ref 8.9–10.3)
CO2: 29 mmol/L (ref 22–32)
CREATININE: 0.99 mg/dL (ref 0.61–1.24)
Chloride: 101 mmol/L (ref 101–111)
GFR calc Af Amer: >60 mL/min (ref >60)
GLUCOSE: 93 mg/dL (ref 65–99)
POTASSIUM: 4 mmol/L (ref 3.5–5.1)
Sodium: 138 mmol/L (ref 135–145)
TOTAL PROTEIN: 7.1 g/dL (ref 6.5–8.1)

## 2016-01-13 LAB — URINALYSIS, ROUTINE W REFLEX MICROSCOPIC
BILIRUBIN URINE: NEGATIVE
Glucose, UA: NEGATIVE mg/dL
Hgb urine dipstick: NEGATIVE
Ketones, ur: NEGATIVE mg/dL
LEUKOCYTES UA: NEGATIVE
NITRITE: NEGATIVE
PH: 7.5 (ref 5.0–8.0)
Protein, ur: NEGATIVE mg/dL
SPECIFIC GRAVITY, URINE: 1.02 (ref 1.005–1.030)

## 2016-01-13 LAB — SURGICAL PCR SCREEN
MRSA, PCR: NEGATIVE
Staphylococcus aureus: NEGATIVE

## 2016-01-13 LAB — PROTIME-INR
INR: 1.04
PROTHROMBIN TIME: 13.6 s (ref 11.4–15.2)

## 2016-01-13 LAB — CBC
HEMATOCRIT: 45.4 % (ref 39.0–52.0)
HEMOGLOBIN: 15.5 g/dL (ref 13.0–17.0)
MCH: 32.1 pg (ref 26.0–34.0)
MCHC: 34.1 g/dL (ref 30.0–36.0)
MCV: 94 fL (ref 78.0–100.0)
Platelets: 168 10*3/uL (ref 150–400)
RBC: 4.83 MIL/uL (ref 4.22–5.81)
RDW: 12.4 % (ref 11.5–15.5)
WBC: 4.1 10*3/uL (ref 4.0–10.5)

## 2016-01-13 LAB — ABO/RH: ABO/RH(D): A POS

## 2016-01-13 LAB — APTT: APTT: 24 s (ref 24–36)

## 2016-01-13 NOTE — Pre-Procedure Instructions (Signed)
EKG 9'17, Clearance note Dr. Jacelyn Grip ,[place] notes with chart .

## 2016-01-13 NOTE — Patient Instructions (Signed)
Joseph Fletcher  01/13/2016   Your procedure is scheduled on: 01-19-16  Report to [facility] Main  Entrance take [facility]  elevators to 3rd floor to  [place] at [place] AM.  Call this number if you have problems the morning of surgery [phone]   Remember: ONLY 1 PERSON MAY GO WITH YOU TO SHORT STAY TO GET  READY MORNING OF [place].  Do not eat food or drink liquids :After Midnight.     Take these medicines the morning of surgery with A SIP OF WATER:  None. DO NOT TAKE ANY DIABETIC MEDICATIONS DAY OF YOUR SURGERY                               You may not have any metal on your body including hair pins and              piercings  Do not wear jewelry, make-up, lotions, powders or perfumes, deodorant             Do not wear nail polish.  Do not shave  48 hours prior to surgery.              Men may shave face and neck.   Do not bring valuables to the hospital. [name].  Contacts, dentures or bridgework may not be worn into surgery.  Leave suitcase in the car. After surgery it may be brought to your room.     Patients discharged the day of surgery will not be allowed to drive home.  Name and phone number of your driver:Joseph Fletcher-spouse [phone] cell  Special Instructions: N/A              Please read over the following fact sheets you were given: _____________________________________________________________________             [facility] - Preparing for Surgery Before surgery, you can play an important role.  Because skin is not sterile, your skin needs to be as free of germs as possible.  You can reduce the number of germs on your skin by washing with CHG (chlorahexidine gluconate) soap before surgery.  CHG is an antiseptic cleaner which kills germs and bonds with the skin to continue killing germs even after washing. Please DO NOT use if you have an allergy to CHG or antibacterial soaps.  If  your skin becomes reddened/irritated stop using the CHG and inform your nurse when you arrive at Short Stay. Do not shave (including legs and underarms) for at least 48 hours prior to the first CHG shower.  You may shave your face/neck. Please follow these instructions carefully:  1.  Shower with CHG Soap the night before surgery and the  morning of Surgery.  2.  If you choose to wash your hair, wash your hair first as usual with your  normal  shampoo.  3.  After you shampoo, rinse your hair and body thoroughly to remove the  shampoo.                           4.  Use CHG as you would any other liquid soap.  You can apply chg directly  to the skin and  wash                       Gently with a scrungie or clean washcloth.  5.  Apply the CHG Soap to your body ONLY FROM THE NECK DOWN.   Do not use on face/ open                           Wound or open sores. Avoid contact with eyes, ears mouth and genitals (private parts).                       Wash face,  Genitals (private parts) with your normal soap.             6.  Wash thoroughly, paying special attention to the area where your surgery  will be performed.  7.  Thoroughly rinse your body with warm water from the neck down.  8.  DO NOT shower/wash with your normal soap after using and rinsing off  the CHG Soap.                9.  Pat yourself dry with a clean towel.            10.  Wear clean pajamas.            11.  Place clean sheets on your bed the night of your first shower and do not  sleep with pets. Day of Surgery : Do not apply any lotions/deodorants the morning of surgery.  Please wear clean clothes to the hospital/surgery center.  FAILURE TO FOLLOW THESE INSTRUCTIONS MAY RESULT IN THE CANCELLATION OF YOUR SURGERY PATIENT SIGNATURE_________________________________  NURSE SIGNATURE__________________________________  ________________________________________________________________________   [name]  An incentive  spirometer is a tool that can help keep your lungs clear and active. This tool measures how well you are filling your lungs with each breath. Taking long deep breaths may help reverse or decrease the chance of developing breathing (pulmonary) problems (especially infection) following:  A long period of time when you are unable to move or be active. BEFORE THE PROCEDURE   If the spirometer includes an indicator to show your best effort, your nurse or respiratory therapist will set it to a desired goal.  If possible, sit up straight or lean slightly forward. Try not to slouch.  Hold the incentive spirometer in an upright position. INSTRUCTIONS FOR USE  1. Sit on the edge of your bed if possible, or sit up as far as you can in bed or on a chair. 2. Hold the incentive spirometer in an upright position. 3. Breathe out normally. 4. Place the mouthpiece in your mouth and seal your lips tightly around it. 5. Breathe in slowly and as deeply as possible, raising the piston or the ball toward the top of the column. 6. Hold your breath for 3-5 seconds or for as long as possible. Allow the piston or ball to fall to the bottom of the column. 7. Remove the mouthpiece from your mouth and breathe out normally. 8. Rest for a few seconds and repeat Steps 1 through 7 at least 10 times every 1-2 hours when you are awake. Take your time and take a few normal breaths between deep breaths. 9. The spirometer may include an indicator to show your best effort. Use the indicator as a goal to work toward during each repetition. 10. After each set of 10 deep  breaths, practice coughing to be sure your lungs are clear. If you have an incision (the cut made at the time of surgery), support your incision when coughing by placing a pillow or rolled up towels firmly against it. Once you are able to get out of bed, walk around indoors and cough well. You may stop using the incentive spirometer when instructed by your caregiver.   RISKS AND COMPLICATIONS  Take your time so you do not get dizzy or light-headed.  If you are in pain, you may need to take or ask for pain medication before doing incentive spirometry. It is harder to take a deep breath if you are having pain. AFTER USE  Rest and breathe slowly and easily.  It can be helpful to keep track of a log of your progress. Your caregiver can provide you with a simple table to help with this. If you are using the spirometer at home, follow these instructions: [place] IF:   You are having difficultly using the spirometer.  You have trouble using the spirometer as often as instructed.  Your pain medication is not giving enough relief while using the spirometer.  You develop fever of 100.5 F (38.1 C) or higher. SEEK IMMEDIATE MEDICAL CARE IF:   You cough up bloody sputum that had not been present before.  You develop fever of 102 F (38.9 C) or greater.  You develop worsening pain at or near the incision site. MAKE SURE YOU:   Understand these instructions.  Will watch your condition.  Will get help right away if you are not doing well or get worse. Document Released: 06/26/2006 Document Revised: 05/08/2011 Document Reviewed: 08/27/2006 ExitCare Patient Information 2014 Exit[employer], [place].   ________________________________________________________________________  WHAT IS A BLOOD TRANSFUSION? Blood Transfusion Information  A transfusion is the replacement of blood or some of its parts. Blood is made up of multiple cells which provide different functions.  Red blood cells carry oxygen and are used for blood loss replacement.  White blood cells fight against infection.  Platelets control bleeding.  Plasma helps clot blood.  Other blood products are available for specialized needs, such as hemophilia or other clotting disorders. BEFORE THE TRANSFUSION  Who gives blood for transfusions?   Healthy volunteers who are fully evaluated  to make sure their blood is safe. This is blood bank blood. Transfusion therapy is the safest it has ever been in the practice of medicine. Before blood is taken from a donor, a complete history is taken to make sure that person has no history of diseases nor engages in risky social behavior (examples are intravenous drug use or sexual activity with multiple partners). The donor's travel history is screened to minimize risk of transmitting infections, such as malaria. The donated blood is tested for signs of infectious diseases, such as HIV and hepatitis. The blood is then tested to be sure it is compatible with you in order to minimize the chance of a transfusion reaction. If you or a relative donates blood, this is often done in anticipation of surgery and is not appropriate for emergency situations. It takes many days to process the donated blood. RISKS AND COMPLICATIONS Although transfusion therapy is very safe and saves many lives, the main dangers of transfusion include:   Getting an infectious disease.  Developing a transfusion reaction. This is an allergic reaction to something in the blood you were given. Every precaution is taken to prevent this. The decision to have a blood transfusion has  been considered carefully by your caregiver before blood is given. Blood is not given unless the benefits outweigh the risks. AFTER THE TRANSFUSION  Right after receiving a blood transfusion, you will usually feel much better and more energetic. This is especially true if your red blood cells have gotten low (anemic). The transfusion raises the level of the red blood cells which carry oxygen, and this usually causes an energy increase.  The nurse administering the transfusion will monitor you carefully for complications. HOME CARE INSTRUCTIONS  No special instructions are needed after a transfusion. You may find your energy is better. Speak with your caregiver about any limitations on activity for  underlying diseases you may have. SEEK MEDICAL CARE IF:   Your condition is not improving after your transfusion.  You develop redness or irritation at the intravenous (IV) site. SEEK IMMEDIATE MEDICAL CARE IF:  Any of the following symptoms occur over the next 12 hours:  Shaking chills.  You have a temperature by mouth above 102 F (38.9 C), not controlled by medicine.  Chest, back, or muscle pain.  People around you feel you are not acting correctly or are confused.  Shortness of breath or difficulty breathing.  Dizziness and fainting.  You get a rash or develop hives.  You have a decrease in urine output.  Your urine turns a dark color or changes to pink, red, or brown. Any of the following symptoms occur over the next 10 days:  You have a temperature by mouth above 102 F (38.9 C), not controlled by medicine.  Shortness of breath.  Weakness after normal activity.  The white part of the eye turns yellow (jaundice).  You have a decrease in the amount of urine or are urinating less often.  Your urine turns a dark color or changes to pink, red, or brown. Document Released: 02/11/2000 Document Revised: 05/08/2011 Document Reviewed: 09/30/2007 [facility] Patient Information 2014 [place], [place].  _______________________________________________________________________

## 2016-01-18 ENCOUNTER — Ambulatory Visit: Payer: Self-pay | Admitting: Orthopedic Surgery

## 2016-01-18 NOTE — H&P (Signed)
Joseph Fletcher DOB: 06/18/1950 Married / Language: Joseph Fletcher / Race: White Male Date of Admission:  01/19/2016 CC:  Left Hip Pain History of Present Illness  The patient is a 65 year old male who comes in for a preoperative History and Physical. The patient is scheduled for a left total hip arthroplasty (anterior) to be performed by Dr. Dione Fletcher. Aluisio, MD at [facility] on 01-19-2016. The patient reports left hip symptoms that have been present for 1 year(s). The symptoms began without any known injury. Symptoms reported include hip pain, weakness, pain with weightbearing, stiffness, difficulty bearing weight and difficulty ambulating The patient does not report any radiation of symptoms. The symptoms are described as moderate in severity. The patient feels as if their symptoms are does feel they are worsening. He is at the point where he is having to cut out certain things that he likes to do, such as playing tennis. He is having pain in the left groin that radiates down the anterior thigh. He is ready to have the left hip replaced. He had the right hip replaced in January of 2014 at [facility]. He reports that the left hip is feeling exactly like the right hip did. Unfortunately, Joseph Fletcher's hip has gotten progressively worse over time. He has had a problem for quite a while now. In the past year or so, he has had marked increase in discomfort and dysfunction. He is at a stage now where it is hurting most times and limiting what he can and cannot do. He even gets discomfort at night. He has had a lot of functional limitations also. He is ready to get the left hip fixed. They have been treated conservatively in the past for the above stated problem and despite conservative measures, they continue to have progressive pain and severe functional limitations and dysfunction. They have failed non-operative management including home exercise, medications. It is felt that they would benefit from undergoing  total joint replacement. Risks and benefits of the procedure have been discussed with the patient and they elect to proceed with surgery. There are no active contraindications to surgery such as ongoing infection or rapidly progressive neurological disease.  Problem List/Past Medical Primary osteoarthritis of left hip (M16.12)  Osteoarthritis  Skin Cancer  Squamous Cell  Allergies  No Known Drug Allergies  Family History  Congestive Heart Failure  Paternal Grandmother. First Degree Relatives  reported Heart Disease  Father. Heart disease in male family member before age 47  Osteoarthritis  Mother.  Social History Children  2 Current drinker  10/28/2015: Currently drinks beer and wine 5-7 times per week Current work status  working part time Exercise  Exercises daily; does running / walking, other and gym / Joseph Fletcher Living situation  live with spouse Marital status  married No history of drug/alcohol rehab  Not under pain contract  Number of flights of stairs before winded  4-5 Tobacco / smoke exposure  10/28/2015: no Tobacco use  Never smoker. 10/28/2015: uses less than 1/2 can(s) smokeless per week  Medication History  Advil (200MG  Capsule, Oral) Active. Fish Oil (1000MG  Capsule, Oral) Active. Multivitamin Adult (Oral) Active.  Past Surgical History Total Hip Replacement  right  Review of Systems  General Not Present- Chills, Fatigue, Fever, Memory Loss, Night Sweats, Weight Gain and Weight Loss. Skin Not Present- Eczema, Hives, Itching, Lesions and Rash. HEENT Not Present- Dentures, Double Vision, Headache, Hearing Loss, Tinnitus and Visual Loss. Respiratory Not Present- Allergies, Chronic Cough, Coughing up blood, Shortness  of breath at rest and Shortness of breath with exertion. Cardiovascular Not Present- Chest Pain, Difficulty Breathing Lying Down, Murmur, Palpitations, Racing/skipping heartbeats and Swelling. Gastrointestinal Not Present-  Abdominal Pain, Bloody Stool, Constipation, Diarrhea, Difficulty Swallowing, Heartburn, Jaundice, Loss of appetitie, Nausea and Vomiting. Male Genitourinary Not Present- Blood in Urine, Discharge, Flank Pain, Incontinence, Painful Urination, Urgency, Urinary frequency, Urinary Retention, Urinating at Night and Weak urinary stream. Musculoskeletal Present- Joint Pain. Not Present- Back Pain, Joint Swelling, Morning Stiffness, Muscle Pain, Muscle Weakness and Spasms. Neurological Not Present- Blackout spells, Difficulty with balance, Dizziness, Paralysis, Tremor and Weakness. Psychiatric Not Present- Insomnia.  Vitals Weight: 172 lb Height: 69in Weight was reported by patient. Height was reported by patient. Body Surface Area: 1.94 m Body Mass Index: 25.4 kg/m  Pulse: 60 (Regular)  BP: 128/78 (Sitting, Right Arm, Standard)  Physical Exam General Mental Status -Alert, cooperative and good historian. General Appearance-pleasant, Not in acute distress. Orientation-Oriented X3. Build & Nutrition-Well nourished and Well developed.  Head and Neck Head-normocephalic, atraumatic . Neck Global Assessment - supple, no bruit auscultated on the right, no bruit auscultated on the left.  Eye Vision-Wears corrective lenses. Pupil - Bilateral-Regular and Round. Motion - Bilateral-EOMI.  Chest and Lung Exam Auscultation Breath sounds - clear at anterior chest wall and clear at posterior chest wall. Adventitious sounds - No Adventitious sounds.  Cardiovascular Auscultation Rhythm - Regular rate and rhythm. Heart Sounds - S1 WNL and S2 WNL. Murmurs & Other Heart Sounds - Auscultation of the heart reveals - No Murmurs.  Abdomen Palpation/Percussion Tenderness - Abdomen is non-tender to palpation. Rigidity (guarding) - Abdomen is soft. Auscultation Auscultation of the abdomen reveals - Bowel sounds normal.  Male Genitourinary Note: Not done, not pertinent to present  illness  Musculoskeletal Note: He is alert and oriented, no apparent distress. His right hip has normal motion, no discomfort. Left hip flexion about 100, minimal internal rotation, about 10 to 20 degrees of external rotation, 30 degrees of abduction. He has a significantly antalgic gait pattern.  RADIOGRAPHS AP pelvis, AP and lateral of the left hip shows significant bone on bone arthritis in that hip with subchondral cystic formation.  Assessment & Plan Primary osteoarthritis of left hip (M16.12)  Note:Surgical Plans: Left Total Hip Replacement - Anterior Approach  Disposition: Home  PCP: Dr. Yaakov Guthrie - Patient has been seen preoperatively and felt to be stable for surgery.  IV TXA  Anesthesia Issues: None  Signed electronically by Ok Edwards, III PA-C

## 2016-01-19 ENCOUNTER — Inpatient Hospital Stay (HOSPITAL_COMMUNITY): Payer: Commercial Managed Care - PPO | Admitting: Certified Registered Nurse Anesthetist

## 2016-01-19 ENCOUNTER — Inpatient Hospital Stay (HOSPITAL_COMMUNITY): Payer: Commercial Managed Care - PPO

## 2016-01-19 ENCOUNTER — Encounter (HOSPITAL_COMMUNITY): Payer: Self-pay | Admitting: *Deleted

## 2016-01-19 ENCOUNTER — Inpatient Hospital Stay (HOSPITAL_COMMUNITY)
Admission: RE | Admit: 2016-01-19 | Discharge: 2016-01-20 | DRG: 470 | Disposition: A | Discharge status: Home / Self Care | Payer: Commercial Managed Care - PPO | Source: Ambulatory Visit | Attending: Orthopedic Surgery | Admitting: Orthopedic Surgery

## 2016-01-19 ENCOUNTER — Encounter (HOSPITAL_COMMUNITY)
Admission: RE | Disposition: A | Discharge status: Home / Self Care | Payer: Self-pay | Source: Ambulatory Visit | Attending: Orthopedic Surgery

## 2016-01-19 DIAGNOSIS — Z85828 Personal history of other malignant neoplasm of skin: Secondary | ICD-10-CM

## 2016-01-19 DIAGNOSIS — M1612 Unilateral primary osteoarthritis, left hip: Secondary | ICD-10-CM | POA: Diagnosis present

## 2016-01-19 DIAGNOSIS — Z96641 Presence of right artificial hip joint: Secondary | ICD-10-CM | POA: Diagnosis present

## 2016-01-19 DIAGNOSIS — M169 Osteoarthritis of hip, unspecified: Secondary | ICD-10-CM

## 2016-01-19 DIAGNOSIS — Z96649 Presence of unspecified artificial hip joint: Secondary | ICD-10-CM

## 2016-01-19 HISTORY — PX: TOTAL HIP ARTHROPLASTY: SHX124

## 2016-01-19 LAB — TYPE AND SCREEN
ABO/RH(D): A POS
Antibody Screen: NEGATIVE

## 2016-01-19 SURGERY — ARTHROPLASTY, HIP, TOTAL, ANTERIOR APPROACH
Anesthesia: Monitor Anesthesia Care | Site: Hip | Laterality: Left

## 2016-01-19 MED ORDER — PROPOFOL 10 MG/ML IV BOLUS
INTRAVENOUS | Status: AC
  Filled 2016-01-19: qty 20

## 2016-01-19 MED ORDER — FLEET ENEMA 7-19 GM/118ML RE ENEM: 1.0000 | ENEMA | Freq: Once | RECTAL | Status: DC | PRN

## 2016-01-19 MED ORDER — ONDANSETRON HCL 4 MG/2ML IJ SOLN
4.0000 mg | Freq: Four times a day (QID) | INTRAMUSCULAR | Status: DC | PRN
  Administered 2016-01-19: 4 mg via INTRAVENOUS
  Filled 2016-01-19: qty 2

## 2016-01-19 MED ORDER — ACETAMINOPHEN 325 MG PO TABS: 650.0000 mg | ORAL_TABLET | Freq: Four times a day (QID) | ORAL | Status: DC | PRN

## 2016-01-19 MED ORDER — OXYCODONE HCL 5 MG PO TABS
5.0000 mg | ORAL_TABLET | ORAL | Status: DC | PRN
  Administered 2016-01-19 – 2016-01-20 (×2): 5 mg via ORAL
  Filled 2016-01-19: qty 2
  Filled 2016-01-19: qty 1

## 2016-01-19 MED ORDER — MIDAZOLAM HCL 2 MG/2ML IJ SOLN
INTRAMUSCULAR | Status: AC
  Filled 2016-01-19: qty 2

## 2016-01-19 MED ORDER — LACTATED RINGERS IV SOLN
INTRAVENOUS | Status: DC
  Administered 2016-01-19 (×2): via INTRAVENOUS

## 2016-01-19 MED ORDER — TRANEXAMIC ACID 1000 MG/10ML IV SOLN
INTRAVENOUS | Status: DC | PRN
  Administered 2016-01-19: 2000 mg via TOPICAL

## 2016-01-19 MED ORDER — OXYCODONE HCL 5 MG/5ML PO SOLN
5.0000 mg | Freq: Once | ORAL | Status: AC | PRN
  Filled 2016-01-19: qty 5

## 2016-01-19 MED ORDER — DEXAMETHASONE SODIUM PHOSPHATE 10 MG/ML IJ SOLN
10.0000 mg | Freq: Once | INTRAMUSCULAR | Status: AC
  Administered 2016-01-19: 10 mg via INTRAVENOUS

## 2016-01-19 MED ORDER — 0.9 % SODIUM CHLORIDE (POUR BTL) OPTIME
TOPICAL | Status: DC | PRN
  Administered 2016-01-19: 1000 mL

## 2016-01-19 MED ORDER — ONDANSETRON HCL 4 MG/2ML IJ SOLN
INTRAMUSCULAR | Status: AC
  Filled 2016-01-19: qty 2

## 2016-01-19 MED ORDER — ONDANSETRON HCL 4 MG/2ML IJ SOLN
INTRAMUSCULAR | Status: DC | PRN
  Administered 2016-01-19: 4 mg via INTRAVENOUS

## 2016-01-19 MED ORDER — BISACODYL 10 MG RE SUPP: 10.0000 mg | Freq: Every day | RECTAL | Status: DC | PRN

## 2016-01-19 MED ORDER — OXYCODONE HCL 5 MG PO TABS
5.0000 mg | ORAL_TABLET | Freq: Once | ORAL | Status: AC | PRN
  Administered 2016-01-19: 5 mg via ORAL

## 2016-01-19 MED ORDER — FENTANYL CITRATE (PF) 100 MCG/2ML IJ SOLN
INTRAMUSCULAR | Status: DC | PRN
  Administered 2016-01-19 (×2): 50 ug via INTRAVENOUS

## 2016-01-19 MED ORDER — FENTANYL CITRATE (PF) 100 MCG/2ML IJ SOLN: 25.0000 ug | INTRAMUSCULAR | Status: DC | PRN

## 2016-01-19 MED ORDER — METHOCARBAMOL 1000 MG/10ML IJ SOLN
500.0000 mg | Freq: Four times a day (QID) | INTRAVENOUS | Status: DC | PRN
  Administered 2016-01-19: 500 mg via INTRAVENOUS
  Filled 2016-01-19: qty 550
  Filled 2016-01-19: qty 5

## 2016-01-19 MED ORDER — CEFAZOLIN SODIUM-DEXTROSE 2-4 GM/100ML-% IV SOLN
2.0000 g | Freq: Four times a day (QID) | INTRAVENOUS | Status: AC
  Administered 2016-01-19 (×2): 2 g via INTRAVENOUS
  Filled 2016-01-19 (×2): qty 100

## 2016-01-19 MED ORDER — MIDAZOLAM HCL 5 MG/5ML IJ SOLN
INTRAMUSCULAR | Status: DC | PRN
  Administered 2016-01-19 (×2): 1 mg via INTRAVENOUS

## 2016-01-19 MED ORDER — ONDANSETRON HCL 4 MG PO TABS: 4.0000 mg | ORAL_TABLET | Freq: Four times a day (QID) | ORAL | Status: DC | PRN

## 2016-01-19 MED ORDER — DEXAMETHASONE SODIUM PHOSPHATE 10 MG/ML IJ SOLN
10.0000 mg | Freq: Once | INTRAMUSCULAR | Status: AC
  Administered 2016-01-20: 10 mg via INTRAVENOUS
  Filled 2016-01-19: qty 1

## 2016-01-19 MED ORDER — DEXAMETHASONE SODIUM PHOSPHATE 10 MG/ML IJ SOLN
INTRAMUSCULAR | Status: AC
  Filled 2016-01-19: qty 1

## 2016-01-19 MED ORDER — DOCUSATE SODIUM 100 MG PO CAPS
100.0000 mg | ORAL_CAPSULE | Freq: Two times a day (BID) | ORAL | Status: DC
  Administered 2016-01-19 – 2016-01-20 (×2): 100 mg via ORAL
  Filled 2016-01-19 (×2): qty 1

## 2016-01-19 MED ORDER — ONDANSETRON HCL 4 MG/2ML IJ SOLN: 4.0000 mg | Freq: Once | INTRAMUSCULAR | Status: DC | PRN

## 2016-01-19 MED ORDER — BUPIVACAINE HCL (PF) 0.25 % IJ SOLN
INTRAMUSCULAR | Status: DC | PRN
  Administered 2016-01-19: 30 mL

## 2016-01-19 MED ORDER — TRAMADOL HCL 50 MG PO TABS
50.0000 mg | ORAL_TABLET | Freq: Four times a day (QID) | ORAL | Status: DC | PRN
Start: 2016-01-19 — End: 2016-01-20

## 2016-01-19 MED ORDER — BUPIVACAINE IN DEXTROSE 0.75-8.25 % IT SOLN
INTRATHECAL | Status: DC | PRN
  Administered 2016-01-19: 2 mL via INTRATHECAL

## 2016-01-19 MED ORDER — ACETAMINOPHEN 10 MG/ML IV SOLN
1000.0000 mg | Freq: Once | INTRAVENOUS | Status: AC
  Administered 2016-01-19: 1000 mg via INTRAVENOUS

## 2016-01-19 MED ORDER — DIPHENHYDRAMINE HCL 12.5 MG/5ML PO ELIX: 12.5000 mg | ORAL_SOLUTION | ORAL | Status: DC | PRN

## 2016-01-19 MED ORDER — ACETAMINOPHEN 500 MG PO TABS
1000.0000 mg | ORAL_TABLET | Freq: Four times a day (QID) | ORAL | Status: DC
  Administered 2016-01-19 – 2016-01-20 (×3): 1000 mg via ORAL
  Filled 2016-01-19 (×3): qty 2

## 2016-01-19 MED ORDER — CHLORHEXIDINE GLUCONATE 4 % EX LIQD: 60.0000 mL | Freq: Once | CUTANEOUS | Status: DC

## 2016-01-19 MED ORDER — OXYCODONE HCL 5 MG PO TABS
ORAL_TABLET | ORAL | Status: AC
  Administered 2016-01-19: 5 mg via ORAL
  Filled 2016-01-19: qty 1

## 2016-01-19 MED ORDER — METHOCARBAMOL 500 MG PO TABS: 500.0000 mg | ORAL_TABLET | Freq: Four times a day (QID) | ORAL | Status: DC | PRN

## 2016-01-19 MED ORDER — STERILE WATER FOR IRRIGATION IR SOLN
Status: DC | PRN
  Administered 2016-01-19: 2000 mL

## 2016-01-19 MED ORDER — SODIUM CHLORIDE 0.9 % IV SOLN
INTRAVENOUS | Status: DC
  Administered 2016-01-19 – 2016-01-20 (×2): via INTRAVENOUS

## 2016-01-19 MED ORDER — METOCLOPRAMIDE HCL 5 MG PO TABS: 5.0000 mg | ORAL_TABLET | Freq: Three times a day (TID) | ORAL | Status: DC | PRN

## 2016-01-19 MED ORDER — METOCLOPRAMIDE HCL 5 MG/ML IJ SOLN: 5.0000 mg | Freq: Three times a day (TID) | INTRAMUSCULAR | Status: DC | PRN

## 2016-01-19 MED ORDER — PHENOL 1.4 % MT LIQD
1.0000 | OROMUCOSAL | Status: DC | PRN
  Filled 2016-01-19: qty 177

## 2016-01-19 MED ORDER — FENTANYL CITRATE (PF) 100 MCG/2ML IJ SOLN
INTRAMUSCULAR | Status: AC
  Filled 2016-01-19: qty 2

## 2016-01-19 MED ORDER — MENTHOL 3 MG MT LOZG: 1.0000 | LOZENGE | OROMUCOSAL | Status: DC | PRN

## 2016-01-19 MED ORDER — POLYETHYLENE GLYCOL 3350 17 G PO PACK: 17.0000 g | PACK | Freq: Every day | ORAL | Status: DC | PRN

## 2016-01-19 MED ORDER — TRANEXAMIC ACID 1000 MG/10ML IV SOLN
2000.0000 mg | Freq: Once | INTRAVENOUS | Status: DC
  Filled 2016-01-19: qty 20

## 2016-01-19 MED ORDER — PROPOFOL 500 MG/50ML IV EMUL
INTRAVENOUS | Status: DC | PRN
  Administered 2016-01-19: 100 ug/kg/min via INTRAVENOUS

## 2016-01-19 MED ORDER — ACETAMINOPHEN 650 MG RE SUPP: 650.0000 mg | Freq: Four times a day (QID) | RECTAL | Status: DC | PRN

## 2016-01-19 MED ORDER — CEFAZOLIN SODIUM-DEXTROSE 2-4 GM/100ML-% IV SOLN
2.0000 g | INTRAVENOUS | Status: AC
  Administered 2016-01-19: 2 g via INTRAVENOUS

## 2016-01-19 MED ORDER — RIVAROXABAN 10 MG PO TABS
10.0000 mg | ORAL_TABLET | Freq: Every day | ORAL | Status: DC
  Administered 2016-01-20: 10 mg via ORAL
  Filled 2016-01-19: qty 1

## 2016-01-19 MED ORDER — BUPIVACAINE LIPOSOME 1.3 % IJ SUSP
20.0000 mL | Freq: Once | INTRAMUSCULAR | Status: DC
  Filled 2016-01-19: qty 20

## 2016-01-19 MED ORDER — BUPIVACAINE HCL (PF) 0.25 % IJ SOLN
INTRAMUSCULAR | Status: AC
  Filled 2016-01-19: qty 30

## 2016-01-19 MED ORDER — HYDROMORPHONE HCL 1 MG/ML IJ SOLN
0.5000 mg | INTRAMUSCULAR | Status: DC | PRN
  Administered 2016-01-19: 0.5 mg via INTRAVENOUS
  Filled 2016-01-19: qty 1

## 2016-01-19 SURGICAL SUPPLY — 40 items
BAG DECANTER FOR FLEXI CONT (MISCELLANEOUS) ×2 IMPLANT
BAG SPEC THK2 15X12 ZIP CLS (MISCELLANEOUS) ×1
BAG ZIPLOCK 12X15 (MISCELLANEOUS) ×1 IMPLANT
BLADE SAG 18X100X1.27 (BLADE) ×2 IMPLANT
CAPT HIP TOTAL 2 ×1 IMPLANT
CLOTH BEACON ORANGE TIMEOUT ST (SAFETY) ×2 IMPLANT
COVER PERINEAL POST (MISCELLANEOUS) ×2 IMPLANT
DECANTER SPIKE VIAL GLASS SM (MISCELLANEOUS) ×2 IMPLANT
DRAPE STERI IOBAN 125X83 (DRAPES) ×2 IMPLANT
DRAPE U-SHAPE 47X51 STRL (DRAPES) ×4 IMPLANT
DRSG ADAPTIC 3X8 NADH LF (GAUZE/BANDAGES/DRESSINGS) ×2 IMPLANT
DRSG MEPILEX BORDER 4X4 (GAUZE/BANDAGES/DRESSINGS) ×2 IMPLANT
DRSG MEPILEX BORDER 4X8 (GAUZE/BANDAGES/DRESSINGS) ×2 IMPLANT
DURAPREP 26ML APPLICATOR (WOUND CARE) ×2 IMPLANT
ELECT REM PT RETURN 9FT ADLT (ELECTROSURGICAL) ×2
ELECTRODE REM PT RTRN 9FT ADLT (ELECTROSURGICAL) ×1 IMPLANT
EVACUATOR 1/8 PVC DRAIN (DRAIN) ×2 IMPLANT
GLOVE BIO SURGEON STRL SZ7.5 (GLOVE) ×2 IMPLANT
GLOVE BIO SURGEON STRL SZ8 (GLOVE) ×4 IMPLANT
GLOVE BIOGEL PI IND STRL 6.5 (GLOVE) IMPLANT
GLOVE BIOGEL PI IND STRL 7.5 (GLOVE) IMPLANT
GLOVE BIOGEL PI IND STRL 8 (GLOVE) ×2 IMPLANT
GLOVE BIOGEL PI INDICATOR 6.5 (GLOVE) ×1
GLOVE BIOGEL PI INDICATOR 7.5 (GLOVE) ×3
GLOVE BIOGEL PI INDICATOR 8 (GLOVE) ×2
GLOVE SURG SS PI 6.5 STRL IVOR (GLOVE) ×1 IMPLANT
GLOVE SURG SS PI 7.5 STRL IVOR (GLOVE) ×1 IMPLANT
GOWN SPEC L3 XXLG W/TWL (GOWN DISPOSABLE) ×1 IMPLANT
GOWN STRL REUS W/TWL LRG LVL3 (GOWN DISPOSABLE) ×3 IMPLANT
GOWN STRL REUS W/TWL XL LVL3 (GOWN DISPOSABLE) ×2 IMPLANT
PACK ANTERIOR HIP CUSTOM (KITS) ×2 IMPLANT
STRIP CLOSURE SKIN 1/2X4 (GAUZE/BANDAGES/DRESSINGS) ×2 IMPLANT
SUT ETHIBOND NAB CT1 #1 30IN (SUTURE) ×2 IMPLANT
SUT MNCRL AB 4-0 PS2 18 (SUTURE) ×2 IMPLANT
SUT VIC AB 2-0 CT1 27 (SUTURE) ×4
SUT VIC AB 2-0 CT1 TAPERPNT 27 (SUTURE) ×2 IMPLANT
SUT VLOC 180 0 24IN GS25 (SUTURE) ×2 IMPLANT
SYR 50ML LL SCALE MARK (SYRINGE) IMPLANT
TRAY FOLEY W/METER SILVER 16FR (SET/KITS/TRAYS/PACK) ×2 IMPLANT
YANKAUER SUCT BULB TIP 10FT TU (MISCELLANEOUS) ×2 IMPLANT

## 2016-01-19 NOTE — H&P (View-Only) (Signed)
Joseph Fletcher DOB: Sep 28, 1950 Married / Language: Cleophus Molt / Race: White Male Date of Admission:  01/19/2016 CC:  Left Hip Pain History of Present Illness  The patient is a 65 year old male who comes in for a preoperative History and Physical. The patient is scheduled for a left total hip arthroplasty (anterior) to be performed by Dr. Dione Plover. Aluisio, MD at [facility] on 01-19-2016. The patient reports left hip symptoms that have been present for 1 year(s). The symptoms began without any known injury. Symptoms reported include hip pain, weakness, pain with weightbearing, stiffness, difficulty bearing weight and difficulty ambulating The patient does not report any radiation of symptoms. The symptoms are described as moderate in severity. The patient feels as if their symptoms are does feel they are worsening. He is at the point where he is having to cut out certain things that he likes to do, such as playing tennis. He is having pain in the left groin that radiates down the anterior thigh. He is ready to have the left hip replaced. He had the right hip replaced in January of 2014 at [facility]. He reports that the left hip is feeling exactly like the right hip did. Unfortunately, Rik's hip has gotten progressively worse over time. He has had a problem for quite a while now. In the past year or so, he has had marked increase in discomfort and dysfunction. He is at a stage now where it is hurting most times and limiting what he can and cannot do. He even gets discomfort at night. He has had a lot of functional limitations also. He is ready to get the left hip fixed. They have been treated conservatively in the past for the above stated problem and despite conservative measures, they continue to have progressive pain and severe functional limitations and dysfunction. They have failed non-operative management including home exercise, medications. It is felt that they would benefit from undergoing  total joint replacement. Risks and benefits of the procedure have been discussed with the patient and they elect to proceed with surgery. There are no active contraindications to surgery such as ongoing infection or rapidly progressive neurological disease.  Problem List/Past Medical Primary osteoarthritis of left hip (M16.12)  Osteoarthritis  Skin Cancer  Squamous Cell  Allergies  No Known Drug Allergies  Family History  Congestive Heart Failure  Paternal Grandmother. First Degree Relatives  reported Heart Disease  Father. Heart disease in male family member before age 34  Osteoarthritis  Mother.  Social History Children  2 Current drinker  10/28/2015: Currently drinks beer and wine 5-7 times per week Current work status  working part time Exercise  Exercises daily; does running / walking, other and gym / [employer] Living situation  live with spouse Marital status  married No history of drug/alcohol rehab  Not under pain contract  Number of flights of stairs before winded  4-5 Tobacco / smoke exposure  10/28/2015: no Tobacco use  Never smoker. 10/28/2015: uses less than 1/2 can(s) smokeless per week  Medication History  Advil (200MG  Capsule, Oral) Active. Fish Oil (1000MG  Capsule, Oral) Active. Multivitamin Adult (Oral) Active.  Past Surgical History Total Hip Replacement  right  Review of Systems  General Not Present- Chills, Fatigue, Fever, Memory Loss, Night Sweats, Weight Gain and Weight Loss. Skin Not Present- Eczema, Hives, Itching, Lesions and Rash. HEENT Not Present- Dentures, Double Vision, Headache, Hearing Loss, Tinnitus and Visual Loss. Respiratory Not Present- Allergies, Chronic Cough, Coughing up blood, Shortness  of breath at rest and Shortness of breath with exertion. Cardiovascular Not Present- Chest Pain, Difficulty Breathing Lying Down, Murmur, Palpitations, Racing/skipping heartbeats and Swelling. Gastrointestinal Not Present-  Abdominal Pain, Bloody Stool, Constipation, Diarrhea, Difficulty Swallowing, Heartburn, Jaundice, Loss of appetitie, Nausea and Vomiting. Male Genitourinary Not Present- Blood in Urine, Discharge, Flank Pain, Incontinence, Painful Urination, Urgency, Urinary frequency, Urinary Retention, Urinating at Night and Weak urinary stream. Musculoskeletal Present- Joint Pain. Not Present- Back Pain, Joint Swelling, Morning Stiffness, Muscle Pain, Muscle Weakness and Spasms. Neurological Not Present- Blackout spells, Difficulty with balance, Dizziness, Paralysis, Tremor and Weakness. Psychiatric Not Present- Insomnia.  Vitals Weight: 172 lb Height: 69in Weight was reported by patient. Height was reported by patient. Body Surface Area: 1.94 m Body Mass Index: 25.4 kg/m  Pulse: 60 (Regular)  BP: 128/78 (Sitting, Right Arm, Standard)  Physical Exam General Mental Status -Alert, cooperative and good historian. General Appearance-pleasant, Not in acute distress. Orientation-Oriented X3. Build & Nutrition-Well nourished and Well developed.  Head and Neck Head-normocephalic, atraumatic . Neck Global Assessment - supple, no bruit auscultated on the right, no bruit auscultated on the left.  Eye Vision-Wears corrective lenses. Pupil - Bilateral-Regular and Round. Motion - Bilateral-EOMI.  Chest and Lung Exam Auscultation Breath sounds - clear at anterior chest wall and clear at posterior chest wall. Adventitious sounds - No Adventitious sounds.  Cardiovascular Auscultation Rhythm - Regular rate and rhythm. Heart Sounds - S1 WNL and S2 WNL. Murmurs & Other Heart Sounds - Auscultation of the heart reveals - No Murmurs.  Abdomen Palpation/Percussion Tenderness - Abdomen is non-tender to palpation. Rigidity (guarding) - Abdomen is soft. Auscultation Auscultation of the abdomen reveals - Bowel sounds normal.  Male Genitourinary Note: Not done, not pertinent to present  illness  Musculoskeletal Note: He is alert and oriented, no apparent distress. His right hip has normal motion, no discomfort. Left hip flexion about 100, minimal internal rotation, about 10 to 20 degrees of external rotation, 30 degrees of abduction. He has a significantly antalgic gait pattern.  RADIOGRAPHS AP pelvis, AP and lateral of the left hip shows significant bone on bone arthritis in that hip with subchondral cystic formation.  Assessment & Plan Primary osteoarthritis of left hip (M16.12)  Note:Surgical Plans: Left Total Hip Replacement - Anterior Approach  Disposition: Home  PCP: Dr. Yaakov Guthrie - Patient has been seen preoperatively and felt to be stable for surgery.  IV TXA  Anesthesia Issues: None  Signed electronically by Ok Edwards, III PA-C

## 2016-01-19 NOTE — Interval H&P Note (Signed)
History and Physical Interval Note:  01/19/2016 8:26 AM  Joseph Fletcher  has presented today for surgery, with the diagnosis of left hip osteoarthritis  The various methods of treatment have been discussed with the patient and family. After consideration of risks, benefits and other options for treatment, the patient has consented to  Procedure(s): LEFT TOTAL HIP ARTHROPLASTY ANTERIOR APPROACH (Left) as a surgical intervention .  The patient's history has been reviewed, patient examined, no change in status, stable for surgery.  I have reviewed the patient's chart and labs.  Questions were answered to the patient's satisfaction.     [name]

## 2016-01-19 NOTE — Progress Notes (Signed)
Discharge planning, spoke with patient at bedside. Have chosen [facility] for [facility]. Contacted [facility] for referral. Has RW and 3-n-1 from previous surgery at home. [phone]

## 2016-01-19 NOTE — Transfer of Care (Signed)
Immediate Anesthesia Transfer of Care Note  Patient: Joseph Fletcher  Procedure(s) Performed: Procedure(s): LEFT TOTAL HIP ARTHROPLASTY ANTERIOR APPROACH (Left)  Patient Location: PACU  Anesthesia Type:MAC and Spinal  Level of Consciousness:  sedated, patient cooperative and responds to stimulation  Airway & Oxygen Therapy:Patient Spontanous Breathing and Patient connected to face mask oxgen  Post-op Assessment:  Report given to PACU RN and Post -op Vital signs reviewed and stable  Post vital signs:  Reviewed and stable  Last Vitals:  Vitals:   01/19/16 0637  BP: 115/68  Pulse: (!) 58  Resp: 16  Temp: [id number] C    Complications: No apparent anesthesia complications

## 2016-01-19 NOTE — Anesthesia Procedure Notes (Signed)
Spinal  Patient location during procedure: OR End time: 01/19/2016 9:16 AM Staffing Anesthesiologist: Linna Caprice, DAVID Resident/CRNA: Maxwell Caul Performed: resident/CRNA  Preanesthetic Checklist Completed: patient identified, site marked, surgical consent, pre-op evaluation, timeout performed, IV checked, risks and benefits discussed and monitors and equipment checked Spinal Block Patient position: sitting Prep: Betadine Patient monitoring: heart rate, continuous pulse ox and blood pressure Approach: midline Location: L3-4 Injection technique: single-shot Needle Needle type: Pencan  Needle gauge: 24 G Needle length: 10 cm Catheter size: 20 g Additional Notes Patient sitting on stretcher for SAB placement. Single shot, good flow, no heme. Patient tolerated well. Expiration date 02-26-2017. LOT [id number].

## 2016-01-19 NOTE — Anesthesia Preprocedure Evaluation (Addendum)
Anesthesia Evaluation  Patient identified by MRN, date of birth, ID band Patient awake    Reviewed: Allergy & Precautions, NPO status , Patient's Chart, lab work & pertinent test results  Airway Mallampati: II  TM Distance: >3 FB Neck ROM: Full    Dental  (+) Teeth Intact, Dental Advisory Given   Pulmonary    breath sounds clear to auscultation       Cardiovascular  Rhythm:Regular Rate:Normal     Neuro/Psych    GI/Hepatic   Endo/Other    Renal/GU      Musculoskeletal   Abdominal   Peds  Hematology   Anesthesia Other Findings   Reproductive/Obstetrics                            Anesthesia Physical Anesthesia Plan  ASA: II  Anesthesia Plan: MAC and Spinal   Post-op Pain Management:    Induction: Intravenous  Airway Management Planned: Natural Airway and Simple Face Mask  Additional Equipment:   Intra-op Plan:   Post-operative Plan: Extubation in OR  Informed Consent: I have reviewed the patients History and Physical, chart, labs and discussed the procedure including the risks, benefits and alternatives for the proposed anesthesia with the patient or authorized representative who has indicated his/her understanding and acceptance.   Dental advisory given  Plan Discussed with: Anesthesiologist and CRNA  Anesthesia Plan Comments:         Anesthesia Quick Evaluation

## 2016-01-19 NOTE — Anesthesia Postprocedure Evaluation (Signed)
Anesthesia Post Note  Patient: Joseph Fletcher  Procedure(s) Performed: Procedure(s) (LRB): LEFT TOTAL HIP ARTHROPLASTY ANTERIOR APPROACH (Left)  Patient location during evaluation: PACU Anesthesia Type: MAC and Spinal Level of consciousness: awake, awake and alert and oriented Pain management: pain level controlled Vital Signs Assessment: post-procedure vital signs reviewed and stable Respiratory status: spontaneous breathing, nonlabored ventilation and respiratory function stable Cardiovascular status: blood pressure returned to baseline Postop Assessment: spinal receding Anesthetic complications: no    Last Vitals:  Vitals:   01/19/16 1130 01/19/16 1145  BP: 109/65 115/73  Pulse: (!) 50 (!) 52  Resp: 20 18  Temp:      Last Pain:  Vitals:   01/19/16 1145  TempSrc:   PainSc: 0-No pain                 Ayahna Solazzo COKER

## 2016-01-19 NOTE — Discharge Instructions (Addendum)
Dr. Gaynelle Arabian Total Joint Specialist [facility] [address], [place], [place] [address] [phone]  ANTERIOR APPROACH TOTAL HIP REPLACEMENT POSTOPERATIVE DIRECTIONS   Hip Rehabilitation, Guidelines Following Surgery  The results of a hip operation are greatly improved after range of motion and muscle strengthening exercises. Follow all safety measures which are given to protect your hip. If any of these exercises cause increased pain or swelling in your joint, decrease the amount until you are comfortable again. Then slowly increase the exercises. Call your caregiver if you have problems or questions.   HOME CARE INSTRUCTIONS  Remove items at home which could result in a fall. This includes throw rugs or furniture in walking pathways.   ICE to the affected hip every three hours for 30 minutes at a time and then as needed for pain and swelling.  Continue to use ice on the hip for pain and swelling from surgery. You may notice swelling that will progress down to the foot and ankle.  This is normal after surgery.  Elevate the leg when you are not up walking on it.    Continue to use the breathing machine which will help keep your temperature down.  It is common for your temperature to cycle up and down following surgery, especially at night when you are not up moving around and exerting yourself.  The breathing machine keeps your lungs expanded and your temperature down.   DIET You may resume your previous home diet once your are discharged from the hospital.  DRESSING / WOUND CARE / SHOWERING You may start showering once you are discharged home but do not submerge the incision under water. Just pat the incision dry and apply a dry gauze dressing on daily. Change the surgical dressing daily and reapply a dry dressing each time.  ACTIVITY Walk with your walker as instructed. Use walker as long as suggested by your caregivers. Avoid periods of inactivity  such as sitting longer than an hour when not asleep. This helps prevent blood clots.  You may resume a sexual relationship in one month or when given the OK by your doctor.  You may return to work once you are cleared by your doctor.  Do not drive a car for 6 weeks or until released by you surgeon.  Do not drive while taking narcotics.  WEIGHT BEARING Weight bearing as tolerated with assist device (walker, cane, etc) as directed, use it as long as suggested by your surgeon or therapist, typically at least 4-6 weeks.  POSTOPERATIVE CONSTIPATION PROTOCOL Constipation - defined medically as fewer than three stools per week and severe constipation as less than one stool per week.  One of the most common issues patients have following surgery is constipation.  Even if you have a regular bowel pattern at home, your normal regimen is likely to be disrupted due to multiple reasons following surgery.  Combination of anesthesia, postoperative narcotics, change in appetite and fluid intake all can affect your bowels.  In order to avoid complications following surgery, here are some recommendations in order to help you during your recovery period.  Colace (docusate) - Pick up an over-the-counter form of Colace or another stool softener and take twice a day as long as you are requiring postoperative pain medications.  Take with a full glass of water daily.  If you experience loose stools or diarrhea, hold the colace until you stool forms back up.  If your symptoms do not get better within 1  week or if they get worse, check with your doctor. ° °Dulcolax (bisacodyl) - Pick up over-the-counter and take as directed by the product packaging as needed to assist with the movement of your bowels.  Take with a full glass of water.  Use this product as needed if not relieved by Colace only.  ° °MiraLax (polyethylene glycol) - Pick up over-the-counter to have on hand.  MiraLax is a solution that will increase the amount of  water in your bowels to assist with bowel movements.  Take as directed and can mix with a glass of water, juice, soda, coffee, or tea.  Take if you go more than two days without a movement. °Do not use MiraLax more than once per day. Call your doctor if you are still constipated or irregular after using this medication for 7 days in a row. ° °If you continue to have problems with postoperative constipation, please contact the office for further assistance and recommendations.  If you experience "the worst abdominal pain ever" or develop nausea or vomiting, please contact the office immediatly for further recommendations for treatment. ° °ITCHING ° If you experience itching with your medications, try taking only a single pain pill, or even half a pain pill at a time.  You can also use Benadryl over the counter for itching or also to help with sleep.  ° °TED HOSE STOCKINGS °Wear the elastic stockings on both legs for three weeks following surgery during the day but you may remove then at night for sleeping. ° °MEDICATIONS °See your medication summary on the “After Visit Summary” that the nursing staff will review with you prior to discharge.  You may have some home medications which will be placed on hold until you complete the course of blood thinner medication.  It is important for you to complete the blood thinner medication as prescribed by your surgeon.  Continue your approved medications as instructed at time of discharge. ° °PRECAUTIONS °If you experience chest pain or shortness of breath - call 911 immediately for transfer to the hospital emergency department.  °If you develop a fever greater that 101 F, purulent drainage from wound, increased redness or drainage from wound, foul odor from the wound/dressing, or calf pain - CONTACT YOUR SURGEON.   °                                                °FOLLOW-UP APPOINTMENTS °Make sure you keep all of your appointments after your operation with your surgeon and  caregivers. You should call the office at the above phone number and make an appointment for approximately two weeks after the date of your surgery or on the date instructed by your surgeon outlined in the "After Visit Summary". ° °RANGE OF MOTION AND STRENGTHENING EXERCISES  °These exercises are designed to help you keep full movement of your hip joint. Follow your caregiver's or physical therapist's instructions. Perform all exercises about fifteen times, three times per day or as directed. Exercise both hips, even if you have had only one joint replacement. These exercises can be done on a training (exercise) mat, on the floor, on a table or on a bed. Use whatever works the best and is most comfortable for you. Use music or television while you are exercising so that the exercises are a pleasant break in your day. This   will make your life better with the exercises acting as a break in routine you can look forward to.  Lying on your back, slowly slide your foot toward your buttocks, raising your knee up off the floor. Then slowly slide your foot back down until your leg is straight again.  Lying on your back spread your legs as far apart as you can without causing discomfort.  Lying on your side, raise your upper leg and foot straight up from the floor as far as is comfortable. Slowly lower the leg and repeat.  Lying on your back, tighten up the muscle in the front of your thigh (quadriceps muscles). You can do this by keeping your leg straight and trying to raise your heel off the floor. This helps strengthen the largest muscle supporting your knee.  Lying on your back, tighten up the muscles of your buttocks both with the legs straight and with the knee bent at a comfortable angle while keeping your heel on the floor.   IF YOU ARE TRANSFERRED TO A SKILLED REHAB FACILITY If the patient is transferred to a skilled rehab facility following release from the hospital, a list of the current medications will be  sent to the facility for the patient to continue.  When discharged from the skilled rehab facility, please have the facility set up the patient's [place] prior to being released. Also, the skilled facility will be responsible for providing the patient with their medications at time of release from the facility to include their pain medication, the muscle relaxants, and their blood thinner medication. If the patient is still at the rehab facility at time of the two week follow up appointment, the skilled rehab facility will also need to assist the patient in arranging follow up appointment in our office and any transportation needs.  MAKE SURE YOU:  Understand these instructions.  Get help right away if you are not doing well or get worse.    Pick up stool softner and laxative for home use following surgery while on pain medications. Do not submerge incision under water. Please use good hand washing techniques while changing dressing each day. May shower starting three days after surgery. Please use a clean towel to pat the incision dry following showers. Continue to use ice for pain and swelling after surgery. Do not use any lotions or creams on the incision until instructed by your surgeon.    Information on my medicine - XARELTO (Rivaroxaban)  This medication education was reviewed with me or my healthcare representative as part of my discharge preparation.   Why was Xarelto prescribed for you? Xarelto was prescribed for you to reduce the risk of blood clots forming after orthopedic surgery. The medical term for these abnormal blood clots is venous thromboembolism (VTE).  What do you need to know about xarelto ? Take your Xarelto ONCE DAILY at the same time every day. You may take it either with or without food.  If you have difficulty swallowing the tablet whole, you may crush it and mix in applesauce just prior to taking your dose.  Take Xarelto exactly as  prescribed by your doctor and DO NOT stop taking Xarelto without talking to the doctor who prescribed the medication.  Stopping without other VTE prevention medication to take the place of Xarelto may increase your risk of developing a clot.  After discharge, you should have regular check-up appointments with your healthcare provider that is prescribing your Xarelto.  What do you do if you miss a dose? If you miss a dose, take it as soon as you remember on the same day then continue your regularly scheduled once daily regimen the next day. Do not take two doses of Xarelto on the same day.   Important Safety Information A possible side effect of Xarelto is bleeding. You should call your healthcare provider right away if you experience any of the following: ? Bleeding from an injury or your nose that does not stop. ? Unusual colored urine (red or dark brown) or unusual colored stools (red or black). ? Unusual bruising for unknown reasons. ? A serious fall or if you hit your head (even if there is no bleeding).  Some medicines may interact with Xarelto and might increase your risk of bleeding while on Xarelto. To help avoid this, consult your healthcare provider or pharmacist prior to using any new prescription or non-prescription medications, including herbals, vitamins, non-steroidal anti-inflammatory drugs (NSAIDs) and supplements.  This website has more information on Xarelto: [email or web address]

## 2016-01-19 NOTE — Anesthesia Procedure Notes (Signed)
Date/Time: 01/19/2016 9:20 AM Performed by: Maxwell Caul Pre-anesthesia Checklist: Patient identified and Patient being monitored Patient Re-evaluated:Patient Re-evaluated prior to inductionOxygen Delivery Method: Simple face mask

## 2016-01-19 NOTE — Evaluation (Signed)
Physical Therapy Evaluation Patient Details Name: Joseph Fletcher MRN: [id number] DOB: 27-Jan-1951 Today's Date: 01/19/2016   History of Present Illness  Pt s/p L THR and with hx of R THR (anterolateral approach at [facility] in 2013)  Clinical Impression  Pt s/p L THR presents with decreased L LE strength/ROM and post op pain limiting functional mobility.  Pt should progress to dc home with family assist and HHPT follow up.    Follow Up Recommendations Home health PT    Equipment Recommendations  None recommended by PT    Recommendations for Other Services OT consult     Precautions / Restrictions Precautions Precautions: Fall Restrictions Weight Bearing Restrictions: No Other Position/Activity Restrictions: WBAT      Mobility  Bed Mobility Overal bed mobility: Needs Assistance Bed Mobility: Supine to Sit     Supine to sit: Min assist     General bed mobility comments: cues for sequence and use of R LE to self assist  Transfers Overall transfer level: Needs assistance Equipment used: Rolling walker (2 wheeled) Transfers: Sit to/from Stand Sit to Stand: Min assist         General transfer comment: cues for LE management and use of UEs to self assist  Ambulation/Gait Ambulation/Gait assistance: Min assist Ambulation Distance (Feet): 26 Feet Assistive device: Rolling walker (2 wheeled) Gait Pattern/deviations: Step-to pattern;Decreased step length - right;Decreased step length - left;Shuffle;Trunk flexed Gait velocity: decr Gait velocity interpretation: Below normal speed for age/gender General Gait Details: cues for posture, sequence and position from RW.  Distance ltd by onset nausea  Stairs            Wheelchair Mobility    Modified Rankin (Stroke Patients Only)       Balance                                             Pertinent Vitals/Pain Pain Assessment: 0-10 Pain Score: 3  Pain Location: L hip Pain Descriptors /  Indicators: Aching;Sore Pain Intervention(s): Limited activity within patient's tolerance;Monitored during session;Premedicated before session;Ice applied    Home Living Family/patient expects to be discharged to:: Private residence Living Arrangements: Spouse/significant other Available Help at Discharge: Family Type of Home: House Home Access: Stairs to enter Entrance Stairs-Rails: Right Entrance Stairs-Number of Steps: 3 Home Layout: One level Home Equipment: Environmental consultant - 2 wheels;Crutches      Prior Function Level of Independence: Independent               Hand Dominance        Extremity/Trunk Assessment   Upper Extremity Assessment: Overall WFL for tasks assessed           Lower Extremity Assessment: LLE deficits/detail      Cervical / Trunk Assessment: Normal  Communication   Communication: No difficulties  Cognition Arousal/Alertness: Awake/alert Behavior During Therapy: WFL for tasks assessed/performed Overall Cognitive Status: Within Functional Limits for tasks assessed                      General Comments      Exercises Total Joint Exercises Ankle Circles/Pumps: AROM;Both;15 reps;Supine   Assessment/Plan    PT Assessment Patient needs continued PT services  PT Problem List Decreased strength;Decreased range of motion;Decreased activity tolerance;Decreased mobility;Decreased knowledge of use of DME;Pain          PT Treatment Interventions  DME instruction;Gait training;Stair training;Functional mobility training;Therapeutic activities;Therapeutic exercise;Patient/family education    PT Goals (Current goals can be found in the Care Plan section)  Acute Rehab PT Goals Patient Stated Goal: Regain IND PT Goal Formulation: With patient Time For Goal Achievement: 01/22/16 Potential to Achieve Goals: Good    Frequency 7X/week   Barriers to discharge        Co-evaluation               End of Session Equipment Utilized During  Treatment: Gait belt Activity Tolerance: Patient tolerated treatment well;Other (comment) (nausea) Patient left: in chair;with call bell/phone within reach;with family/visitor present;with chair alarm set Nurse Communication: Mobility status;Other (comment) (nausea)         Time: [id number] PT Time Calculation (min) (ACUTE ONLY): 25 min   Charges:   PT Evaluation $PT Eval Low Complexity: 1 Procedure PT Treatments $Gait Training: 8-22 mins   PT G Codes:        Johnattan Strassman Jan 28, 2016, 5:28 PM

## 2016-01-19 NOTE — Op Note (Signed)
OPERATIVE REPORT- TOTAL HIP ARTHROPLASTY   PREOPERATIVE DIAGNOSIS: Osteoarthritis of the Left hip.   POSTOPERATIVE DIAGNOSIS: Osteoarthritis of the Left  hip.   PROCEDURE: Left total hip arthroplasty, anterior approach.   SURGEON: Gaynelle Arabian, MD   ASSISTANT: Arlee Muslim, PA-C  ANESTHESIA:  Spinal  ESTIMATED BLOOD LOSS:-450 ml    DRAINS: Hemovac x1.   COMPLICATIONS: None   CONDITION: PACU - hemodynamically stable.   BRIEF CLINICAL NOTE: Joseph Fletcher is a 65 y.o. male who has advanced end-  stage arthritis of their Left  hip with progressively worsening pain and  dysfunction.The patient has failed nonoperative management and presents for  total hip arthroplasty.   PROCEDURE IN DETAIL: After successful administration of spinal  anesthetic, the traction boots for the [facility] bed were placed on both  feet and the patient was placed onto the [facility] bed, boots placed into the leg  holders. The Left hip was then isolated from the perineum with plastic  drapes and prepped and draped in the usual sterile fashion. ASIS and  greater trochanter were marked and a oblique incision was made, starting  at about 1 cm lateral and 2 cm distal to the ASIS and coursing towards  the anterior cortex of the femur. The skin was cut with a 10 blade  through subcutaneous tissue to the level of the fascia overlying the  tensor fascia lata muscle. The fascia was then incised in line with the  incision at the junction of the anterior third and posterior 2/3rd. The  muscle was teased off the fascia and then the interval between the TFL  and the rectus was developed. The Hohmann retractor was then placed at  the top of the femoral neck over the capsule. The vessels overlying the  capsule were cauterized and the fat on top of the capsule was removed.  A Hohmann retractor was then placed anterior underneath the rectus  femoris to give exposure to the entire anterior capsule. A T-shaped   capsulotomy was performed. The edges were tagged and the femoral head  was identified.       Osteophytes are removed off the superior acetabulum.  The femoral neck was then cut in situ with an oscillating saw. Traction  was then applied to the left lower extremity utilizing the [facility]  traction. The femoral head was then removed. Retractors were placed  around the acetabulum and then circumferential removal of the labrum was  performed. Osteophytes were also removed. Reaming starts at 47 mm to  medialize and  Increased in 2 mm increments to 51 mm. We reamed in  approximately 40 degrees of abduction, 20 degrees anteversion. A 52 mm  pinnacle acetabular shell was then impacted in anatomic position under  fluoroscopic guidance with excellent purchase. We did not need to place  any additional dome screws. A 32 mm neutral + 4 marathon liner was then  placed into the acetabular shell.       The femoral lift was then placed along the lateral aspect of the femur  just distal to the vastus ridge. The leg was  externally rotated and capsule  was stripped off the inferior aspect of the femoral neck down to the  level of the lesser trochanter, this was done with electrocautery. The femur was lifted after this was performed. The  leg was then placed in an extended and adducted position essentially delivering the femur. We also removed the capsule superiorly and the piriformis from the piriformis  fossa to gain excellent exposure of the  proximal femur. Rongeur was used to remove some cancellous bone to get  into the lateral portion of the proximal femur for placement of the  initial starter reamer. The starter broaches was placed  the starter broach  and was shown to go down the center of the canal. Broaching  with the  Corail system was then performed starting at size 8, coursing  Up to size 11. A size 11 had excellent torsional and rotational  and axial stability. The trial standard offset neck was then  placed  with a 32 + 5 trial head. The hip was then reduced. We confirmed that  the stem was in the canal both on AP and lateral x-rays. It also has excellent sizing. The hip was reduced with outstanding stability through full extension and full external rotation.. AP pelvis was taken and the leg lengths were measured and found to be equal. Hip was then dislocated again and the femoral head and neck removed. The  femoral broach was removed. Size 11 Corail stem with a standard offset  neck was then impacted into the femur following native anteversion. Has  excellent purchase in the canal. Excellent torsional and rotational and  axial stability. It is confirmed to be in the canal on AP and lateral  fluoroscopic views. The 32 + 5 ceramic head was placed and the hip  reduced with outstanding stability. Again AP pelvis was taken and it  confirmed that the leg lengths were equal. The wound was then copiously  irrigated with saline solution and the capsule reattached and repaired  with Ethibond suture. 30 ml of .25% Bupivicaine was  injected into the capsule and into the edge of the tensor fascia lata as well as subcutaneous tissue. The fascia overlying the tensor fascia lata was then closed with a running #1 V-Loc. Subcu was closed with interrupted 2-0 Vicryl and subcuticular running 4-0 Monocryl. Incision was cleaned  and dried. Steri-Strips and a bulky sterile dressing applied. Hemovac  drain was hooked to suction and then the patient was awakened and transported to  recovery in stable condition.        Please note that a surgical assistant was a medical necessity for this procedure to perform it in a safe and expeditious manner. Assistant was necessary to provide appropriate retraction of vital neurovascular structures and to prevent femoral fracture and allow for anatomic placement of the prosthesis.  Gaynelle Arabian, M.D.

## 2016-01-20 LAB — CBC
HEMATOCRIT: 35.1 % — AB (ref 39.0–52.0)
Hemoglobin: 12.3 g/dL — ABNORMAL LOW (ref 13.0–17.0)
MCH: 31.7 pg (ref 26.0–34.0)
MCHC: 35 g/dL (ref 30.0–36.0)
MCV: 90.5 fL (ref 78.0–100.0)
Platelets: 158 10*3/uL (ref 150–400)
RBC: 3.88 MIL/uL — ABNORMAL LOW (ref 4.22–5.81)
RDW: 12 % (ref 11.5–15.5)
WBC: 10.7 10*3/uL — ABNORMAL HIGH (ref 4.0–10.5)

## 2016-01-20 LAB — BASIC METABOLIC PANEL
Anion gap: 7 (ref 5–15)
BUN: 13 mg/dL (ref 6–20)
CALCIUM: 8.9 mg/dL (ref 8.9–10.3)
CO2: 26 mmol/L (ref 22–32)
CREATININE: 0.82 mg/dL (ref 0.61–1.24)
Chloride: 105 mmol/L (ref 101–111)
GFR calc non Af Amer: >60 mL/min (ref >60)
Glucose, Bld: 154 mg/dL — ABNORMAL HIGH (ref 65–99)
Potassium: 3.9 mmol/L (ref 3.5–5.1)
SODIUM: 138 mmol/L (ref 135–145)

## 2016-01-20 MED ORDER — TRAMADOL HCL 50 MG PO TABS: 50.0000 mg | ORAL_TABLET | Freq: Four times a day (QID) | ORAL | 1 refills | Status: DC | PRN

## 2016-01-20 MED ORDER — METHOCARBAMOL 500 MG PO TABS: 500.0000 mg | ORAL_TABLET | Freq: Four times a day (QID) | ORAL | 1 refills | Status: DC | PRN

## 2016-01-20 MED ORDER — OXYCODONE HCL 5 MG PO TABS: 5.0000 mg | ORAL_TABLET | ORAL | 0 refills | Status: DC | PRN

## 2016-01-20 MED ORDER — RIVAROXABAN 10 MG PO TABS: 10.0000 mg | ORAL_TABLET | Freq: Every day | ORAL | 0 refills | Status: DC

## 2016-01-20 NOTE — Progress Notes (Signed)
Pt to d/c home with [facility]. No DME needs. AVS reviewed and "My Chart" discussed with pt. Pt capable of verbalizing medications, dressing changes, signs and symptoms of infection, and follow-up appointments. Remains hemodynamically stable. No signs and symptoms of distress. Educated pt to return to ER in the case of SOB, dizziness, or chest pain.

## 2016-01-20 NOTE — Progress Notes (Signed)
Physical Therapy Treatment Patient Details Name: Joseph Fletcher MRN: [id number] DOB: [birth year] Today's Date: 01/20/2016    History of Present Illness Pt s/p L THR and with hx of R THR (anterolateral approach at [facility] in 2013)    PT Comments    POD # 1 Second PT session needed to address stair training with spouse.   Pt ready for D/C to home  Follow Up Recommendations  Home health PT     Equipment Recommendations  None recommended by PT    Recommendations for Other Services       Precautions / Restrictions Precautions Precautions: Fall Restrictions Weight Bearing Restrictions: No Other Position/Activity Restrictions: WBAT    Mobility  Bed Mobility  OOB in recliner  Transfers Overall transfer level: Needs assistance Equipment used: Rolling walker (2 wheeled) Transfers: Sit to/from Stand Sit to Stand: Supervision;Min guard         General transfer comment: one VC on proper L LE placement prior to sit/stand  Ambulation/Gait Ambulation/Gait assistance: Supervision;Min guard Ambulation Distance (Feet): 55 Feet Assistive device: Rolling walker (2 wheeled) Gait Pattern/deviations: Step-to pattern;Step-through pattern Gait velocity: decr   General Gait Details: < 25% VC's on proper walker to self distance and safety with turns   Stairs Stairs: Yes Stairs assistance: Min guard Stair Management: One rail Left;Step to pattern;Forwards;With crutches Number of Stairs: 4 General stair comments: with spouse practiced 4 steps forward with one crutch and one rail.  50% VC's on proper sequencing and crutch placement.   Wheelchair Mobility    Modified Rankin (Stroke Patients Only)       Balance                                    Cognition Arousal/Alertness: Awake/alert Behavior During Therapy: WFL for tasks assessed/performed Overall Cognitive Status: Within Functional Limits for tasks assessed                      Exercises       General Comments        Pertinent Vitals/Pain Pain Assessment: 0-10 Pain Score: 3  Pain Location: L hip Pain Descriptors / Indicators: Aching;Tender Pain Intervention(s): Monitored during session;Repositioned;Ice applied    Home Living Family/patient expects to be discharged to:: Private residence Living Arrangements: Spouse/significant other Available Help at Discharge: Family         Home Equipment: Bedside commode      Prior Function Level of Independence: Independent          PT Goals (current goals can now be found in the care plan section) Acute Rehab PT Goals Patient Stated Goal: Regain IND Progress towards PT goals: Progressing toward goals    Frequency    7X/week      PT Plan Current plan remains appropriate    Co-evaluation             End of Session Equipment Utilized During Treatment: Gait belt Activity Tolerance: Patient tolerated treatment well Patient left: in chair;with call bell/phone within reach;with family/visitor present     Time: [id number] PT Time Calculation (min) (ACUTE ONLY): 14 min  Charges:  $Gait Training: 8-22 mins                    G Codes:      Rica Koyanagi  PTA [facility]  Acute  Rehab Pager      [phone]

## 2016-01-20 NOTE — Evaluation (Signed)
Occupational Therapy Evaluation Patient Details Name: Joseph Fletcher MRN: [id number] DOB: [birth year] Today's Date: 01/20/2016    History of Present Illness Pt s/p L THR and with hx of R THR (anterolateral approach at [facility] in 2013)   Clinical Impression   This 65 year old man was admitted for the above sx. All education was completed.  No further OT is needed at this time.      Follow Up Recommendations  No OT follow up    Equipment Recommendations  None recommended by OT    Recommendations for Other Services       Precautions / Restrictions Precautions Precautions: Fall Restrictions Weight Bearing Restrictions: No Other Position/Activity Restrictions: WBAT      Mobility Bed Mobility               General bed mobility comments: oob  Transfers   Equipment used: Rolling walker (2 wheeled)   Sit to Stand: Min assist         General transfer comment: cues for LE management and use of UEs to self assist    Balance                                            ADL Overall ADL's : Needs assistance/impaired                     Lower Body Dressing: Moderate assistance;Sit to/from stand (pants, shoes)   Toilet Transfer: Supervision/safety;Ambulation;BSC       Tub/ Shower Transfer: Walk-in shower;Supervision/safety;Ambulation     General ADL Comments: pt has help for adls, and he still has reacher and long shoehorn at home.  Reviewed bathroom transfers.       Vision     Perception     Praxis      Pertinent Vitals/Pain Pain Score: 2  Pain Location: L hip Pain Descriptors / Indicators: Aching;Sore Pain Intervention(s): Limited activity within patient's tolerance;Monitored during session;Premedicated before session;Repositioned;Ice applied     Hand Dominance     Extremity/Trunk Assessment Upper Extremity Assessment Upper Extremity Assessment: Overall WFL for tasks assessed           Communication  Communication Communication: No difficulties   Cognition Arousal/Alertness: Awake/alert Behavior During Therapy: WFL for tasks assessed/performed Overall Cognitive Status: Within Functional Limits for tasks assessed                     General Comments       Exercises       Shoulder Instructions      Home Living Family/patient expects to be discharged to:: Private residence Living Arrangements: Spouse/significant other Available Help at Discharge: Family               Bathroom Shower/Tub: Walk-in Corporate treasurer Toilet: Handicapped height     Home Equipment: Bedside commode          Prior Functioning/Environment Level of Independence: Independent                 OT Problem List:     OT Treatment/Interventions:      OT Goals(Current goals can be found in the care plan section) Acute Rehab OT Goals Patient Stated Goal: Regain IND OT Goal Formulation: With patient  OT Frequency:     Barriers to D/C:  Co-evaluation              End of Session    Activity Tolerance: Patient tolerated treatment well Patient left: in chair;with call bell/phone within reach   Time: 0910-0930 OT Time Calculation (min): 20 min Charges:  OT General Charges $OT Visit: 1 Procedure OT Evaluation $OT Eval Low Complexity: 1 Procedure G-Codes:    Joseph Fletcher 2016-01-30, 10:40 AM  Lesle Chris, OTR/L [phone] 01-30-16

## 2016-01-20 NOTE — Progress Notes (Signed)
    Subjective: 1 Day Post-Op Procedure(s) (LRB): LEFT TOTAL HIP ARTHROPLASTY ANTERIOR APPROACH (Left) Patient reports pain as 0 on 0-10 scale.   Denies CP or SOB.  Foley recently removed.  Has not voided yet. Positive flatus. Objective: Vital signs in last 24 hours: Temp:  [97.3 F (36.3 C)-98.2 F (36.8 C)] 98.2 F (36.8 C) (11/23 0510) Pulse Rate:  [47-63] 62 (11/23 0622) Resp:  [10-20] 16 (11/23 0622) BP: (98-124)/(55-76) 124/55 (11/23 0510) SpO2:  [97 %-100 %] 99 % (11/23 0622) Weight:  [79.4 kg (175 lb)] 79.4 kg (175 lb) (11/22 1940)  Intake/Output from previous day: 11/22 0701 - 11/23 0700 In: 4496.7 [P.O.:1500; I.V.:2841.7; IV Piggyback:155] Out: 3095 [Urine:2670; Drains:25; Blood:400] Intake/Output this shift: Total I/O In: 163.3 [I.V.:163.3] Out: -   Labs:  Recent Labs  01/20/16 0422  HGB 12.3*    Recent Labs  01/20/16 0422  WBC 10.7*  RBC 3.88*  HCT 35.1*  PLT 158    Recent Labs  01/20/16 0422  NA 138  K 3.9  CL 105  CO2 26  BUN 13  CREATININE 0.82  GLUCOSE 154*  CALCIUM 8.9   No results for input(s): LABPT, INR in the last 72 hours.  Physical Exam: Neurologically intact ABD soft Sensation intact distally Dorsiflexion/Plantar flexion intact Incision: scant drainage Compartment soft  Assessment/Plan: 1 Day Post-Op Procedure(s) (LRB): LEFT TOTAL HIP ARTHROPLASTY ANTERIOR APPROACH (Left) Advance diet Up with therapy Discharge home with home health  Post op meds provided May Dc after cleared by PT and voiding w/o difficulty  Mayo, Darla Lesches for Dr. Melina Schools [facility] [phone] 01/20/2016, 8:43 AM

## 2016-01-20 NOTE — Progress Notes (Addendum)
Physical Therapy Treatment Patient Details Name: Joseph Fletcher MRN: [id number] DOB: January 19, 1951 Today's Date: 01/20/2016    History of Present Illness Pt s/p L THR and with hx of R THR (anterolateral approach at [facility] in 2013)    PT Comments    POD # 1 am session Performed all supine TE's following HEP handout.  Assisted to EOB demonstrated use of bale to assist L LE.  Performed sitting TE then assisted to standing to perform all standing TE's.  Pt c/o mild fatigue so assisted to recliner to rest.  Applied ICE.  Will need a second PT session to address stair training.    Follow Up Recommendations  Home health PT     Equipment Recommendations  None recommended by PT    Recommendations for Other Services       Precautions / Restrictions Precautions Precautions: Fall Restrictions Weight Bearing Restrictions: No Other Position/Activity Restrictions: WBAT    Mobility  Bed Mobility Overal bed mobility: Needs Assistance Bed Mobility: Supine to Sit     Supine to sit: Supervision;Min guard     General bed mobility comments: demonstrated and instructed how to use a belt to assist L LE on/off bed.   Transfers Overall transfer level: Needs assistance Equipment used: Rolling walker (2 wheeled) Transfers: Sit to/from Stand Sit to Stand: Supervision;Min guard         General transfer comment: one VC on proper L LE placement prior to sit/stand  Ambulation/Gait Session focused on HEP Stairs  Wheelchair Mobility    Modified Rankin (Stroke Patients Only)       Balance                                    Cognition Arousal/Alertness: Awake/alert Behavior During Therapy: WFL for tasks assessed/performed Overall Cognitive Status: Within Functional Limits for tasks assessed                      Exercises   Total Hip Replacement TE's 10 reps ankle pumps 10 reps knee presses 10 reps heel slides 10 reps SAQ's 10 reps ABD 10 reps LAQ's 10 reps  of all standing TE's Followed by ICE HEP handout issued    General Comments        Pertinent Vitals/Pain Pain Assessment: 0-10 Pain Score: 3  Pain Location: L hip Pain Descriptors / Indicators: Aching;Tender Pain Intervention(s): Monitored during session;Repositioned;Ice applied    Home Living Family/patient expects to be discharged to:: Private residence Living Arrangements: Spouse/significant other Available Help at Discharge: Family         Home Equipment: Bedside commode      Prior Function Level of Independence: Independent          PT Goals (current goals can now be found in the care plan section) Acute Rehab PT Goals Patient Stated Goal: Regain IND Progress towards PT goals: Progressing toward goals    Frequency    7X/week      PT Plan Current plan remains appropriate    Co-evaluation             End of Session Equipment Utilized During Treatment: Gait belt Activity Tolerance: Patient tolerated treatment well Patient left: in chair;with call bell/phone within reach;with family/visitor present     Time: [id number] PT Time Calculation (min) (ACUTE ONLY): 26 min  Charges:  $Therapeutic Exercise: 8-22 mins $Therapeutic Activity: 8-22 mins  G Codes:      Rica Koyanagi  PTA [facility]  Acute  Rehab Pager      [phone]

## 2016-02-03 NOTE — Discharge Summary (Signed)
Physician Discharge Summary  Patient ID: Joseph Fletcher MRN: [id number] DOB/AGE: 1950-09-05 65 y.o.  Admit date: 01/19/2016 Discharge date: 02/03/2016  Admission Diagnoses:  OA (osteoarthritis) of hip  Discharge Diagnoses:  Principal Problem:   OA (osteoarthritis) of hip   Past Medical History:  Diagnosis Date  . Arthritis    osteoarthritis- hips, fingers  . Cancer ([place])    basal cell right ear    Surgeries: Procedure(s): LEFT TOTAL HIP ARTHROPLASTY ANTERIOR APPROACH on 01/19/2016   Consultants (if any):   Discharged Condition: Improved  Hospital Course: Joseph Fletcher is an 65 y.o. male who was admitted 01/19/2016 with a diagnosis of OA (osteoarthritis) of hip and went to the operating room on 01/19/2016 and underwent the above named procedures.  Post op day one pt worked with PT.  Pt voided before DC.  Pts pain controlled on oral medication  He was given perioperative antibiotics:  Anti-infectives    Start     Dose/Rate Route Frequency Ordered Stop   01/19/16 1530  ceFAZolin (ANCEF) IVPB 2g/100 mL premix     2 g 200 mL/hr over 30 Minutes Intravenous Every 6 hours 01/19/16 1240 01/19/16 2211   01/19/16 0640  ceFAZolin (ANCEF) IVPB 2g/100 mL premix     2 g 200 mL/hr over 30 Minutes Intravenous On call to O.R. 01/19/16 [id number] 01/19/16 0919    .  He was given sequential compression devices, early ambulation, and TED for DVT prophylaxis.  He benefited maximally from the hospital stay and there were no complications.    Recent vital signs:  Vitals:   01/20/16 0533 01/20/16 0622  BP:    Pulse: (!) 58 62  Resp: 16 16  Temp:      Recent laboratory studies:  Lab Results  Component Value Date   HGB 12.3 (L) 01/20/2016   HGB 15.5 01/13/2016   HGB 15.1 03/03/2011   Lab Results  Component Value Date   WBC 10.7 (H) 01/20/2016   PLT 158 01/20/2016   Lab Results  Component Value Date   INR 1.04 01/13/2016   Lab Results  Component Value Date   NA 138 01/20/2016    K 3.9 01/20/2016   CL 105 01/20/2016   CO2 26 01/20/2016   BUN 13 01/20/2016   CREATININE 0.82 01/20/2016   GLUCOSE 154 (H) 01/20/2016    Discharge Medications:     Medication List    STOP taking these medications   ADVIL 200 MG tablet Generic drug:  ibuprofen   amoxicillin 500 MG capsule Commonly known as:  AMOXIL   FISH OIL PO   VITAMIN D3 PO     TAKE these medications   methocarbamol 500 MG tablet Commonly known as:  ROBAXIN Take 1 tablet (500 mg total) by mouth every 6 (six) hours as needed for muscle spasms.   oxyCODONE 5 MG immediate release tablet Commonly known as:  Oxy IR/ROXICODONE Take 1-2 tablets (5-10 mg total) by mouth every 3 (three) hours as needed for breakthrough pain.   rivaroxaban 10 MG Tabs tablet Commonly known as:  XARELTO Take 1 tablet (10 mg total) by mouth daily with breakfast.   traMADol 50 MG tablet Commonly known as:  ULTRAM Take 1-2 tablets (50-100 mg total) by mouth every 6 (six) hours as needed for moderate pain.   VIAGRA 100 MG tablet Generic drug:  sildenafil Take 100 mg by mouth daily as needed for erectile dysfunction.       Diagnostic Studies: Dg Pelvis Portable  Result Date: 01/19/2016 CLINICAL DATA:  Left hip replacement EXAM: PORTABLE PELVIS 1-2 VIEWS COMPARISON:  None. FINDINGS: Interval left total hip arthroplasty without failure or complication. Postsurgical changes in the surrounding soft tissues. Surgical drain in place. Prior right hip arthroplasty. Heterotopic ossification adjacent to the right greater trochanter. No acute fracture or dislocation. IMPRESSION: Interval left total hip arthroplasty. Electronically Signed   By: Kathreen Devoid   On: 01/19/2016 11:35   Dg C-arm 1-60 Min-no Report  Result Date: 01/27/2016 Fluoroscopy was utilized by the requesting physician.  No radiographic interpretation.    Disposition: 01-Home or Self Care Pt was discharged home with home health Post op meds provided Pt will  present to clinic in two weeks   Follow-up Information    [facility] Follow up.   Specialty:  [address] Why:  physical therapy Contact information: [place] [address] [phone]        [name], MD. Schedule an appointment as soon as possible for a visit on 02/01/2016.   Specialty:  Orthopedic Surgery Why:  Call [phone] Monday to make the appointment Contact information: [address] [place] [address] [id number]            Signed: Valinda Hoar 02/03/2016, 2:16 PM

## 2016-02-28 HISTORY — PX: COLONOSCOPY: SHX174

## 2016-04-26 ENCOUNTER — Encounter: Payer: Self-pay | Admitting: Gastroenterology

## 2016-05-31 ENCOUNTER — Encounter: Payer: Self-pay | Admitting: Gastroenterology

## 2016-05-31 ENCOUNTER — Ambulatory Visit (AMBULATORY_SURGERY_CENTER): Payer: Self-pay

## 2016-05-31 VITALS — Ht 69.0 in | Wt 179.0 lb

## 2016-05-31 DIAGNOSIS — Z1211 Encounter for screening for malignant neoplasm of colon: Secondary | ICD-10-CM

## 2016-05-31 MED ORDER — SUPREP BOWEL PREP KIT 17.5-3.13-1.6 GM/177ML PO SOLN: 1.0000 | Freq: Once | ORAL | 0 refills | Status: AC

## 2016-05-31 NOTE — Progress Notes (Signed)
No allergies to eggs or soy No diet meds No home oxygen No past problems with anesthesia  Declined emmi 

## 2016-06-14 ENCOUNTER — Ambulatory Visit (AMBULATORY_SURGERY_CENTER): Payer: Commercial Managed Care - PPO | Admitting: Gastroenterology

## 2016-06-14 ENCOUNTER — Encounter: Payer: Self-pay | Admitting: Gastroenterology

## 2016-06-14 VITALS — BP 104/69 | HR 55 | Temp 98.4°F | Resp 16 | Ht 69.0 in | Wt 179.0 lb

## 2016-06-14 DIAGNOSIS — D123 Benign neoplasm of transverse colon: Secondary | ICD-10-CM

## 2016-06-14 DIAGNOSIS — Z1212 Encounter for screening for malignant neoplasm of rectum: Secondary | ICD-10-CM

## 2016-06-14 DIAGNOSIS — Z1211 Encounter for screening for malignant neoplasm of colon: Secondary | ICD-10-CM | POA: Diagnosis present

## 2016-06-14 DIAGNOSIS — D128 Benign neoplasm of rectum: Secondary | ICD-10-CM | POA: Diagnosis not present

## 2016-06-14 DIAGNOSIS — D129 Benign neoplasm of anus and anal canal: Secondary | ICD-10-CM

## 2016-06-14 MED ORDER — SODIUM CHLORIDE 0.9 % IV SOLN: 500.0000 mL | INTRAVENOUS | Status: AC

## 2016-06-14 NOTE — Progress Notes (Signed)
Report to PACU, RN, vss, BBS= Clear.  

## 2016-06-14 NOTE — Progress Notes (Signed)
Pt's states no medical or surgical changes since previsit or office visit. 

## 2016-06-14 NOTE — Op Note (Signed)
[facility] Patient Name: Joseph Fletcher Procedure Date: 06/14/2016 1:35 PM MRN: [id number] Endoscopist: Milus Banister , MD Age: 66 Referring MD:  Date of Birth: [birth year] Gender: Male Account #: [id number] Procedure:                Colonoscopy Indications:              Screening for colorectal malignant neoplasm;                            colonoscopy 2007 was normal Medicines:                Monitored Anesthesia Care Procedure:                Pre-Anesthesia Assessment:                           - Prior to the procedure, a History and Physical                            was performed, and patient medications and                            allergies were reviewed. The patient's tolerance of                            previous anesthesia was also reviewed. The risks                            and benefits of the procedure and the sedation                            options and risks were discussed with the patient.                            All questions were answered, and informed consent                            was obtained. Prior Anticoagulants: The patient has                            taken no previous anticoagulant or antiplatelet                            agents. ASA Grade Assessment: II - A patient with                            mild systemic disease. After reviewing the risks                            and benefits, the patient was deemed in                            satisfactory condition to undergo the procedure.  After obtaining informed consent, the colonoscope                            was passed under direct vision. Throughout the                            procedure, the patient's blood pressure, pulse, and                            oxygen saturations were monitored continuously. The                            Colonoscope was introduced through the anus and                            advanced to the the cecum, identified by                             appendiceal orifice and ileocecal valve. The                            colonoscopy was performed without difficulty. The                            patient tolerated the procedure well. The quality                            of the bowel preparation was excellent. The                            ileocecal valve, appendiceal orifice, and rectum                            were photographed. Scope In: 1:40:17 PM Scope Out: 1:49:44 PM Scope Withdrawal Time: 0 hours 7 minutes 1 second  Total Procedure Duration: 0 hours 9 minutes 27 seconds  Findings:                 Two sessile polyps were found in the rectum and                            transverse colon. The polyps were 4 to 5 mm in                            size. These polyps were removed with a cold snare.                            Resection and retrieval were complete.                           The exam was otherwise without abnormality on                            direct and retroflexion views. Complications:  No immediate complications. Estimated blood loss:                            None. Estimated Blood Loss:     Estimated blood loss: none. Impression:               - Two 4 to 5 mm polyps in the rectum and in the                            transverse colon, removed with a cold snare.                            Resected and retrieved.                           - The examination was otherwise normal on direct                            and retroflexion views. Recommendation:           - Patient has a contact number available for                            emergencies. The signs and symptoms of potential                            delayed complications were discussed with the                            patient. Return to normal activities tomorrow.                            Written discharge instructions were provided to the                            patient.                           -  Resume previous diet.                           - Continue present medications.                           You will receive a letter within 2-3 weeks with the                            pathology results and my final recommendations.                           If the polyp(s) is proven to be 'pre-cancerous' on                            pathology, you will need repeat colonoscopy in 5  years. If the polyp(s) is NOT 'precancerous' on                            pathology then you should repeat colon cancer                            screening in 10 years with colonoscopy without need                            for colon cancer screening by any method prior to                            then (including stool testing). Milus Banister, MD 06/14/2016 1:51:46 PM This report has been signed electronically.

## 2016-06-14 NOTE — Patient Instructions (Signed)
YOU HAD AN ENDOSCOPIC PROCEDURE TODAY AT THE [facility]:   Refer to the procedure report that was given to you for any specific questions about what was found during the examination.  If the procedure report does not answer your questions, please call your gastroenterologist to clarify.  If you requested that your care partner not be given the details of your procedure findings, then the procedure report has been included in a sealed envelope for you to review at your convenience later.  YOU SHOULD EXPECT: Some feelings of bloating in the abdomen. Passage of more gas than usual.  Walking can help get rid of the air that was put into your GI tract during the procedure and reduce the bloating. If you had a lower endoscopy (such as a colonoscopy or flexible sigmoidoscopy) you may notice spotting of blood in your stool or on the toilet paper. If you underwent a bowel prep for your procedure, you may not have a normal bowel movement for a few days.  Please Note:  You might notice some irritation and congestion in your nose or some drainage.  This is from the oxygen used during your procedure.  There is no need for concern and it should clear up in a day or so.  SYMPTOMS TO REPORT IMMEDIATELY:   Following lower endoscopy (colonoscopy or flexible sigmoidoscopy):  Excessive amounts of blood in the stool  Significant tenderness or worsening of abdominal pains  Swelling of the abdomen that is new, acute  Fever of 100F or higher    For urgent or emergent issues, a gastroenterologist can be reached at any hour by calling [phone].   DIET:  We do recommend a small meal at first, but then you may proceed to your regular diet.  Drink plenty of fluids but you should avoid alcoholic beverages for 24 hours.  ACTIVITY:  You should plan to take it easy for the rest of today and you should NOT DRIVE or use heavy machinery until tomorrow (because of the sedation medicines used during the test).     FOLLOW UP: Our staff will call the number listed on your records the next business day following your procedure to check on you and address any questions or concerns that you may have regarding the information given to you following your procedure. If we do not reach you, we will leave a message.  However, if you are feeling well and you are not experiencing any problems, there is no need to return our call.  We will assume that you have returned to your regular daily activities without incident.  If any biopsies were taken you will be contacted by phone or by letter within the next 1-3 weeks.  Please call us at [phone] if you have not heard about the biopsies in 3 weeks.    SIGNATURES/CONFIDENTIALITY: You and/or your care partner have signed paperwork which will be entered into your electronic medical record.  These signatures attest to the fact that that the information above on your After Visit Summary has been reviewed and is understood.  Full responsibility of the confidentiality of this discharge information lies with you and/or your care-partner.   Resume medications. Information given on polyps. 

## 2016-06-14 NOTE — Progress Notes (Signed)
Called to room to assist during endoscopic procedure.  Patient ID and intended procedure confirmed with present staff. Received instructions for my participation in the procedure from the performing physician.  

## 2016-06-15 ENCOUNTER — Telehealth: Payer: Self-pay | Admitting: *Deleted

## 2016-06-15 NOTE — Telephone Encounter (Signed)
  Follow up Call-  Call back number 06/14/2016  Post procedure Call Back phone  # [phone]  Permission to leave phone message Yes  Some recent data might be hidden     Patient questions:  Do you have a fever, pain , or abdominal swelling? No. Pain Score  0   Have you tolerated food without any problems? Yes.    Have you been able to return to your normal activities? Yes.    Do you have any questions about your discharge instructions: Diet   No. Medications  No. Follow up visit  No.  Do you have questions or concerns about your Care? No.  Actions: * If pain score is 4 or above: No action needed, pain <4.

## 2016-06-15 NOTE — Telephone Encounter (Signed)
  Follow up Call-  Call back number 06/14/2016  Post procedure Call Back phone  # [phone]  Permission to leave phone message Yes  Some recent data might be hidden    [facility]

## 2016-06-20 ENCOUNTER — Encounter: Payer: Self-pay | Admitting: Gastroenterology

## 2017-03-27 DIAGNOSIS — R69 Illness, unspecified: Secondary | ICD-10-CM | POA: Diagnosis not present

## 2017-08-02 DIAGNOSIS — Z820 Family history of epilepsy and other diseases of the nervous system: Secondary | ICD-10-CM | POA: Diagnosis not present

## 2017-08-02 DIAGNOSIS — M25511 Pain in right shoulder: Secondary | ICD-10-CM | POA: Diagnosis not present

## 2017-08-02 DIAGNOSIS — Z136 Encounter for screening for cardiovascular disorders: Secondary | ICD-10-CM | POA: Diagnosis not present

## 2017-08-02 DIAGNOSIS — Z23 Encounter for immunization: Secondary | ICD-10-CM | POA: Diagnosis not present

## 2017-08-02 DIAGNOSIS — Z8249 Family history of ischemic heart disease and other diseases of the circulatory system: Secondary | ICD-10-CM | POA: Diagnosis not present

## 2017-08-02 DIAGNOSIS — Z0000 Encounter for general adult medical examination without abnormal findings: Secondary | ICD-10-CM | POA: Diagnosis not present

## 2017-08-02 DIAGNOSIS — Z125 Encounter for screening for malignant neoplasm of prostate: Secondary | ICD-10-CM | POA: Diagnosis not present

## 2017-08-02 DIAGNOSIS — K529 Noninfective gastroenteritis and colitis, unspecified: Secondary | ICD-10-CM | POA: Diagnosis not present

## 2017-08-15 DIAGNOSIS — M7501 Adhesive capsulitis of right shoulder: Secondary | ICD-10-CM | POA: Diagnosis not present

## 2017-08-27 DIAGNOSIS — M25611 Stiffness of right shoulder, not elsewhere classified: Secondary | ICD-10-CM | POA: Diagnosis not present

## 2017-08-27 DIAGNOSIS — M7501 Adhesive capsulitis of right shoulder: Secondary | ICD-10-CM | POA: Diagnosis not present

## 2017-08-27 DIAGNOSIS — M25511 Pain in right shoulder: Secondary | ICD-10-CM | POA: Diagnosis not present

## 2017-09-11 DIAGNOSIS — M25611 Stiffness of right shoulder, not elsewhere classified: Secondary | ICD-10-CM | POA: Diagnosis not present

## 2017-09-11 DIAGNOSIS — M7501 Adhesive capsulitis of right shoulder: Secondary | ICD-10-CM | POA: Diagnosis not present

## 2017-09-11 DIAGNOSIS — M25511 Pain in right shoulder: Secondary | ICD-10-CM | POA: Diagnosis not present

## 2017-09-19 DIAGNOSIS — M7501 Adhesive capsulitis of right shoulder: Secondary | ICD-10-CM | POA: Diagnosis not present

## 2017-09-19 DIAGNOSIS — M25511 Pain in right shoulder: Secondary | ICD-10-CM | POA: Diagnosis not present

## 2017-09-19 DIAGNOSIS — M25611 Stiffness of right shoulder, not elsewhere classified: Secondary | ICD-10-CM | POA: Diagnosis not present

## 2017-09-24 DIAGNOSIS — M25611 Stiffness of right shoulder, not elsewhere classified: Secondary | ICD-10-CM | POA: Diagnosis not present

## 2017-09-24 DIAGNOSIS — M25511 Pain in right shoulder: Secondary | ICD-10-CM | POA: Diagnosis not present

## 2017-09-24 DIAGNOSIS — M7501 Adhesive capsulitis of right shoulder: Secondary | ICD-10-CM | POA: Diagnosis not present

## 2017-09-25 DIAGNOSIS — R69 Illness, unspecified: Secondary | ICD-10-CM | POA: Diagnosis not present

## 2017-10-10 DIAGNOSIS — M25511 Pain in right shoulder: Secondary | ICD-10-CM | POA: Diagnosis not present

## 2018-02-05 ENCOUNTER — Ambulatory Visit: Payer: Medicare HMO | Admitting: Sports Medicine

## 2018-02-05 ENCOUNTER — Encounter: Payer: Self-pay | Admitting: Sports Medicine

## 2018-02-05 VITALS — BP 126/69 | Ht 70.0 in | Wt 172.0 lb

## 2018-02-05 DIAGNOSIS — G8929 Other chronic pain: Secondary | ICD-10-CM

## 2018-02-05 DIAGNOSIS — M5442 Lumbago with sciatica, left side: Secondary | ICD-10-CM | POA: Diagnosis not present

## 2018-02-05 MED ORDER — GABAPENTIN 300 MG PO CAPS
300.0000 mg | ORAL_CAPSULE | Freq: Three times a day (TID) | ORAL | 2 refills | Status: DC
Start: 2018-02-05 — End: 2018-05-21

## 2018-02-05 MED ORDER — GABAPENTIN 300 MG PO CAPS: 300.0000 mg | ORAL_CAPSULE | Freq: Three times a day (TID) | ORAL | 2 refills | Status: DC

## 2018-02-05 NOTE — Progress Notes (Signed)
HPI Patient presenting complaining of worsening back pain over the last month.  Has history of prior MRI confirming moderate to severe lumbar stenosis from 2007.  That was treated with gabapentin and tramadol at the time.  He is still been able to play tennis, and has pain for a few days after tennis match or after driving.  No current motor deficits, intermittent severe pain, no urinary bowel incontinence.  He denies any recent injury.  Worst instances are getting out of a car after driving for a long period of time.  After he does this he often has 2 to 3-minute intervals where pain is so severe he has to stand still cannot continue walking, these resolved spontaneously.  CC: low back pain   Medications/Interventions Tried: gabapentin/tramadol  See HPI and/or previous note for associated ROS.  Objective: BP 126/69   Ht 5\' 10"  (1.778 m)   Wt 172 lb (78 kg)   BMI 24.68 kg/m  Gen: NAD, well groomed, a/o x3, normal affect.  CV: Well-perfused. Warm.  Resp: Non-labored.  Neuro: Sensation intact throughout. No gross coordination deficits.  Gait: Nonpathologic posture MSK: Patient able to side bend-flex-extend at back with minimal pain.  He said he was not hurting during the exam.  Good strength with flexion-extension of hips and knee.  No sensation deficits distal to feet.  Able to walk on heels, toes and lateral aspect of feet. SLR negative bilaterally although tight over HS bilat with only 45 deg elevation No numbness to light touch  Assessment and plan:  BACK PAIN, LUMBAR Patient presenting complaining of worsening back pain over the last month.  Has history of prior MRI confirming moderate to severe lumbar stenosis from 2007.  That was treated with gabapentin and tramadol at the time.  He is still been able to play tennis, and has pain for a few days after tennis match or after driving.  No current motor deficits, intermittent severe pain, no urinary bowel incontinence.  We will  order MRI for more updated objective assessment.  Will restart gabapentin with plans to slowly increase to 300 3 times daily.   Orders Placed This Encounter  Procedures  . MR Lumbar Spine Wo Contrast    KGY:JEHUD/JSHFWYOV Wt 170 / ht 5'9 / no needs / no claus / no metal removed from eyes by dr Rayne Du bullets or shrapnel / yes hip implants / no sx to spine , brain, heart, eyes or ears / sw w/ pt      Standing Status:   Future    Standing Expiration Date:   04/09/2019    Order Specific Question:   ** REASON FOR EXAM (FREE TEXT)    Answer:   history of spinal stenosis    Order Specific Question:   What is the patient's sedation requirement?    Answer:   No Sedation    Order Specific Question:   Does the patient have a pacemaker or implanted devices?    Answer:   No    Order Specific Question:   Preferred imaging location?    Answer:   [facility] (table limit-550lbs)    Order Specific Question:   Radiology Contrast Protocol - do NOT remove file path    Answer:   [email or web address]    Meds ordered this encounter  Medications  . DISCONTD: gabapentin (NEURONTIN) 300 MG capsule    Sig: Take 1 capsule (300 mg total) by mouth 3 (three) times daily.    Dispense:  90 capsule  Refill:  2  . gabapentin (NEURONTIN) 300 MG capsule    Sig: Take 1 capsule (300 mg total) by mouth 3 (three) times daily.    Dispense:  90 capsule    Refill:  [place], DO PGY2 Resident 02/05/2018 10:56 PM   I observed and examined the patient with the resident and agree with assessment and plan.  Note reviewed and modified by me. Ila Mcgill, MD

## 2018-02-05 NOTE — Assessment & Plan Note (Signed)
Patient presenting complaining of worsening back pain over the last month.  Has history of prior MRI confirming moderate to severe lumbar stenosis from 2007.  That was treated with gabapentin and tramadol at the time.  He is still been able to play tennis, and has pain for a few days after tennis match or after driving.  No current motor deficits, intermittent severe pain, no urinary bowel incontinence.  We will order MRI for more updated objective assessment.  Will restart gabapentin with plans to slowly increase to 300 3 times daily.

## 2018-02-06 ENCOUNTER — Encounter: Payer: Self-pay | Admitting: Sports Medicine

## 2018-02-12 ENCOUNTER — Ambulatory Visit
Admission: RE | Admit: 2018-02-12 | Discharge: 2018-02-12 | Disposition: A | Discharge status: Home / Self Care | Payer: Medicare HMO | Source: Ambulatory Visit | Attending: Sports Medicine | Admitting: Sports Medicine

## 2018-02-12 DIAGNOSIS — M5442 Lumbago with sciatica, left side: Principal | ICD-10-CM

## 2018-02-12 DIAGNOSIS — G8929 Other chronic pain: Secondary | ICD-10-CM

## 2018-02-12 DIAGNOSIS — M48061 Spinal stenosis, lumbar region without neurogenic claudication: Secondary | ICD-10-CM | POA: Diagnosis not present

## 2018-02-25 ENCOUNTER — Encounter: Payer: Self-pay | Admitting: *Deleted

## 2018-02-25 NOTE — Patient Instructions (Signed)
Dr Ellene Route [facility] 03/28/18 @ 930a Arrival time is [address] [place] [address] [phone]

## 2018-03-06 DIAGNOSIS — M48062 Spinal stenosis, lumbar region with neurogenic claudication: Secondary | ICD-10-CM | POA: Diagnosis not present

## 2018-03-06 DIAGNOSIS — M4316 Spondylolisthesis, lumbar region: Secondary | ICD-10-CM | POA: Diagnosis not present

## 2018-03-15 ENCOUNTER — Ambulatory Visit (INDEPENDENT_AMBULATORY_CARE_PROVIDER_SITE_OTHER): Payer: Medicare HMO | Admitting: Psychology

## 2018-03-15 DIAGNOSIS — F4322 Adjustment disorder with anxiety: Secondary | ICD-10-CM

## 2018-03-15 DIAGNOSIS — R69 Illness, unspecified: Secondary | ICD-10-CM | POA: Diagnosis not present

## 2018-04-16 DIAGNOSIS — R69 Illness, unspecified: Secondary | ICD-10-CM | POA: Diagnosis not present

## 2018-05-21 ENCOUNTER — Other Ambulatory Visit: Payer: Self-pay | Admitting: *Deleted

## 2018-05-21 MED ORDER — GABAPENTIN 300 MG PO CAPS: 300.0000 mg | ORAL_CAPSULE | Freq: Three times a day (TID) | ORAL | 6 refills | Status: DC

## 2018-08-20 DIAGNOSIS — Z125 Encounter for screening for malignant neoplasm of prostate: Secondary | ICD-10-CM | POA: Diagnosis not present

## 2018-08-20 DIAGNOSIS — Z1159 Encounter for screening for other viral diseases: Secondary | ICD-10-CM | POA: Diagnosis not present

## 2018-08-20 DIAGNOSIS — H9193 Unspecified hearing loss, bilateral: Secondary | ICD-10-CM | POA: Diagnosis not present

## 2018-08-20 DIAGNOSIS — Z0000 Encounter for general adult medical examination without abnormal findings: Secondary | ICD-10-CM | POA: Diagnosis not present

## 2018-08-20 DIAGNOSIS — Z820 Family history of epilepsy and other diseases of the nervous system: Secondary | ICD-10-CM | POA: Diagnosis not present

## 2018-08-20 DIAGNOSIS — N529 Male erectile dysfunction, unspecified: Secondary | ICD-10-CM | POA: Diagnosis not present

## 2018-08-20 DIAGNOSIS — Z8249 Family history of ischemic heart disease and other diseases of the circulatory system: Secondary | ICD-10-CM | POA: Diagnosis not present

## 2018-08-20 DIAGNOSIS — Z5181 Encounter for therapeutic drug level monitoring: Secondary | ICD-10-CM | POA: Diagnosis not present

## 2018-08-20 DIAGNOSIS — Z136 Encounter for screening for cardiovascular disorders: Secondary | ICD-10-CM | POA: Diagnosis not present

## 2018-08-20 DIAGNOSIS — Z13220 Encounter for screening for lipoid disorders: Secondary | ICD-10-CM | POA: Diagnosis not present

## 2018-09-19 ENCOUNTER — Other Ambulatory Visit: Payer: Self-pay | Admitting: *Deleted

## 2018-09-19 MED ORDER — GABAPENTIN 300 MG PO CAPS: 300.0000 mg | ORAL_CAPSULE | Freq: Three times a day (TID) | ORAL | 6 refills | Status: DC

## 2018-10-01 DIAGNOSIS — M5116 Intervertebral disc disorders with radiculopathy, lumbar region: Secondary | ICD-10-CM | POA: Diagnosis not present

## 2018-10-01 DIAGNOSIS — M5416 Radiculopathy, lumbar region: Secondary | ICD-10-CM | POA: Diagnosis not present

## 2018-10-12 IMAGING — DX DG PORTABLE PELVIS
1 series · 1 of 1 positions shown · non-contrast
Comparison: None.

CLINICAL DATA: Left hip replacement

EXAM:
PORTABLE PELVIS 1-2 VIEWS

[pelvis ap]
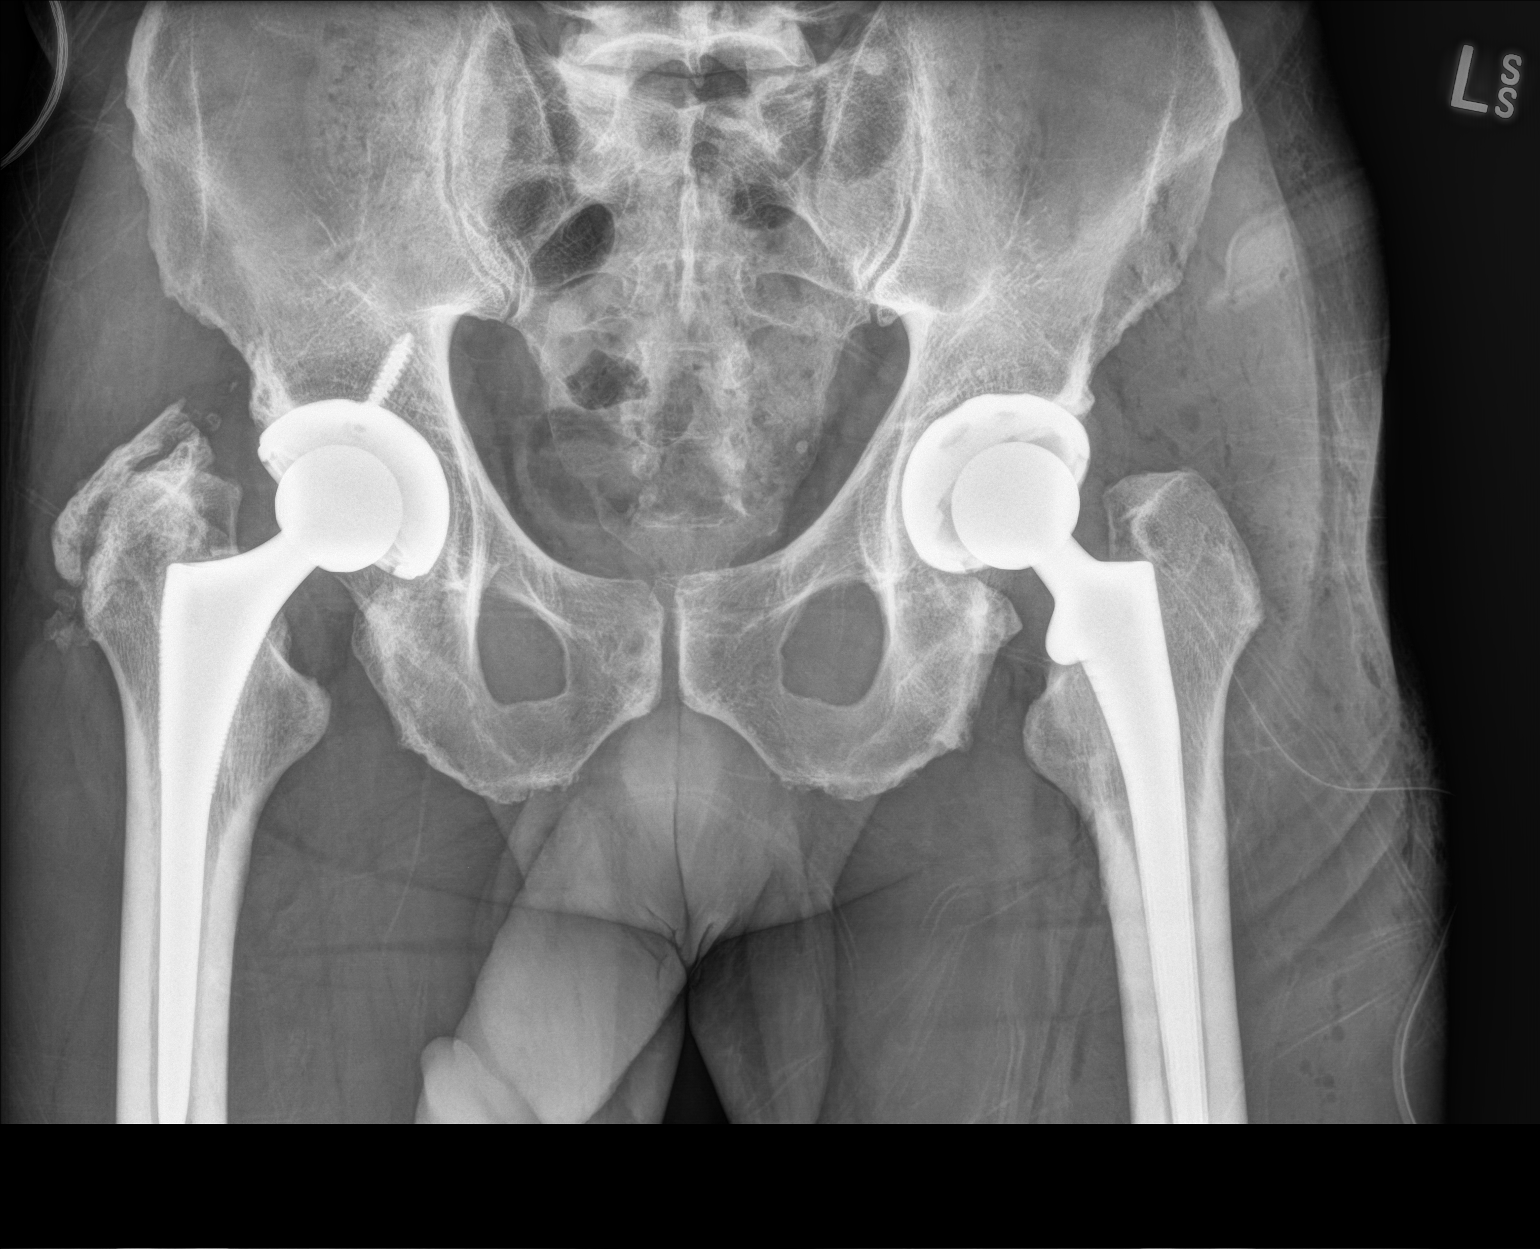

[1 of 1 positions shown; findings below may reference images not displayed]

FINDINGS: Interval left total hip arthroplasty without failure or
complication. Postsurgical changes in the surrounding soft tissues.
Surgical drain in place.

Prior right hip arthroplasty. Heterotopic ossification adjacent to
the right greater trochanter.

No acute fracture or dislocation.
IMPRESSION: Interval left total hip arthroplasty.

## 2018-11-27 DIAGNOSIS — R69 Illness, unspecified: Secondary | ICD-10-CM | POA: Diagnosis not present

## 2018-12-25 DIAGNOSIS — R69 Illness, unspecified: Secondary | ICD-10-CM | POA: Diagnosis not present

## 2019-01-21 DIAGNOSIS — Z23 Encounter for immunization: Secondary | ICD-10-CM | POA: Diagnosis not present

## 2019-01-29 DIAGNOSIS — Z0100 Encounter for examination of eyes and vision without abnormal findings: Secondary | ICD-10-CM | POA: Diagnosis not present

## 2019-01-29 DIAGNOSIS — H2513 Age-related nuclear cataract, bilateral: Secondary | ICD-10-CM | POA: Diagnosis not present

## 2019-02-05 DIAGNOSIS — Z85828 Personal history of other malignant neoplasm of skin: Secondary | ICD-10-CM | POA: Diagnosis not present

## 2019-02-05 DIAGNOSIS — L821 Other seborrheic keratosis: Secondary | ICD-10-CM | POA: Diagnosis not present

## 2019-02-05 DIAGNOSIS — D225 Melanocytic nevi of trunk: Secondary | ICD-10-CM | POA: Diagnosis not present

## 2019-02-05 DIAGNOSIS — L723 Sebaceous cyst: Secondary | ICD-10-CM | POA: Diagnosis not present

## 2019-02-05 DIAGNOSIS — D2261 Melanocytic nevi of right upper limb, including shoulder: Secondary | ICD-10-CM | POA: Diagnosis not present

## 2019-03-19 DIAGNOSIS — M4316 Spondylolisthesis, lumbar region: Secondary | ICD-10-CM | POA: Diagnosis not present

## 2019-03-19 DIAGNOSIS — M48062 Spinal stenosis, lumbar region with neurogenic claudication: Secondary | ICD-10-CM | POA: Diagnosis not present

## 2019-03-27 DIAGNOSIS — M545 Low back pain: Secondary | ICD-10-CM | POA: Diagnosis not present

## 2019-03-27 DIAGNOSIS — M48062 Spinal stenosis, lumbar region with neurogenic claudication: Secondary | ICD-10-CM | POA: Diagnosis not present

## 2019-04-03 DIAGNOSIS — M545 Low back pain: Secondary | ICD-10-CM | POA: Diagnosis not present

## 2019-04-03 DIAGNOSIS — M48062 Spinal stenosis, lumbar region with neurogenic claudication: Secondary | ICD-10-CM | POA: Diagnosis not present

## 2019-04-10 DIAGNOSIS — M48062 Spinal stenosis, lumbar region with neurogenic claudication: Secondary | ICD-10-CM | POA: Diagnosis not present

## 2019-04-10 DIAGNOSIS — M545 Low back pain: Secondary | ICD-10-CM | POA: Diagnosis not present

## 2019-04-23 DIAGNOSIS — M545 Low back pain: Secondary | ICD-10-CM | POA: Diagnosis not present

## 2019-04-23 DIAGNOSIS — M48062 Spinal stenosis, lumbar region with neurogenic claudication: Secondary | ICD-10-CM | POA: Diagnosis not present

## 2019-05-05 DIAGNOSIS — M48062 Spinal stenosis, lumbar region with neurogenic claudication: Secondary | ICD-10-CM | POA: Diagnosis not present

## 2019-05-05 DIAGNOSIS — M545 Low back pain: Secondary | ICD-10-CM | POA: Diagnosis not present

## 2019-05-14 DIAGNOSIS — M48062 Spinal stenosis, lumbar region with neurogenic claudication: Secondary | ICD-10-CM | POA: Diagnosis not present

## 2019-05-14 DIAGNOSIS — M545 Low back pain: Secondary | ICD-10-CM | POA: Diagnosis not present

## 2019-05-21 DIAGNOSIS — M48062 Spinal stenosis, lumbar region with neurogenic claudication: Secondary | ICD-10-CM | POA: Diagnosis not present

## 2019-05-21 DIAGNOSIS — M545 Low back pain: Secondary | ICD-10-CM | POA: Diagnosis not present

## 2019-05-28 DIAGNOSIS — M48062 Spinal stenosis, lumbar region with neurogenic claudication: Secondary | ICD-10-CM | POA: Diagnosis not present

## 2019-05-28 DIAGNOSIS — M545 Low back pain: Secondary | ICD-10-CM | POA: Diagnosis not present

## 2019-06-11 DIAGNOSIS — M48062 Spinal stenosis, lumbar region with neurogenic claudication: Secondary | ICD-10-CM | POA: Diagnosis not present

## 2019-06-11 DIAGNOSIS — M545 Low back pain: Secondary | ICD-10-CM | POA: Diagnosis not present

## 2019-07-11 DIAGNOSIS — M48062 Spinal stenosis, lumbar region with neurogenic claudication: Secondary | ICD-10-CM | POA: Diagnosis not present

## 2019-08-22 DIAGNOSIS — Z125 Encounter for screening for malignant neoplasm of prostate: Secondary | ICD-10-CM | POA: Diagnosis not present

## 2019-08-22 DIAGNOSIS — Z13220 Encounter for screening for lipoid disorders: Secondary | ICD-10-CM | POA: Diagnosis not present

## 2019-08-22 DIAGNOSIS — Z8249 Family history of ischemic heart disease and other diseases of the circulatory system: Secondary | ICD-10-CM | POA: Diagnosis not present

## 2019-08-22 DIAGNOSIS — N529 Male erectile dysfunction, unspecified: Secondary | ICD-10-CM | POA: Diagnosis not present

## 2019-08-22 DIAGNOSIS — Z5181 Encounter for therapeutic drug level monitoring: Secondary | ICD-10-CM | POA: Diagnosis not present

## 2019-08-22 DIAGNOSIS — Z820 Family history of epilepsy and other diseases of the nervous system: Secondary | ICD-10-CM | POA: Diagnosis not present

## 2019-08-22 DIAGNOSIS — Z0000 Encounter for general adult medical examination without abnormal findings: Secondary | ICD-10-CM | POA: Diagnosis not present

## 2019-08-22 DIAGNOSIS — M48061 Spinal stenosis, lumbar region without neurogenic claudication: Secondary | ICD-10-CM | POA: Diagnosis not present

## 2019-11-25 DIAGNOSIS — R69 Illness, unspecified: Secondary | ICD-10-CM | POA: Diagnosis not present

## 2020-05-05 ENCOUNTER — Other Ambulatory Visit: Payer: Medicare HMO

## 2020-08-16 DIAGNOSIS — Z0100 Encounter for examination of eyes and vision without abnormal findings: Secondary | ICD-10-CM | POA: Diagnosis not present

## 2020-08-16 DIAGNOSIS — H5203 Hypermetropia, bilateral: Secondary | ICD-10-CM | POA: Diagnosis not present

## 2020-09-21 DIAGNOSIS — Z1389 Encounter for screening for other disorder: Secondary | ICD-10-CM | POA: Diagnosis not present

## 2020-09-21 DIAGNOSIS — Z0000 Encounter for general adult medical examination without abnormal findings: Secondary | ICD-10-CM | POA: Diagnosis not present

## 2020-09-21 DIAGNOSIS — Z13220 Encounter for screening for lipoid disorders: Secondary | ICD-10-CM | POA: Diagnosis not present

## 2020-09-21 DIAGNOSIS — Z125 Encounter for screening for malignant neoplasm of prostate: Secondary | ICD-10-CM | POA: Diagnosis not present

## 2020-09-21 DIAGNOSIS — L821 Other seborrheic keratosis: Secondary | ICD-10-CM | POA: Diagnosis not present

## 2020-09-21 DIAGNOSIS — M48061 Spinal stenosis, lumbar region without neurogenic claudication: Secondary | ICD-10-CM | POA: Diagnosis not present

## 2020-09-21 DIAGNOSIS — E7800 Pure hypercholesterolemia, unspecified: Secondary | ICD-10-CM | POA: Diagnosis not present

## 2020-09-21 DIAGNOSIS — N401 Enlarged prostate with lower urinary tract symptoms: Secondary | ICD-10-CM | POA: Diagnosis not present

## 2020-09-21 DIAGNOSIS — Z8249 Family history of ischemic heart disease and other diseases of the circulatory system: Secondary | ICD-10-CM | POA: Diagnosis not present

## 2020-09-21 DIAGNOSIS — Z820 Family history of epilepsy and other diseases of the nervous system: Secondary | ICD-10-CM | POA: Diagnosis not present

## 2020-09-21 DIAGNOSIS — Z5181 Encounter for therapeutic drug level monitoring: Secondary | ICD-10-CM | POA: Diagnosis not present

## 2020-09-21 DIAGNOSIS — N529 Male erectile dysfunction, unspecified: Secondary | ICD-10-CM | POA: Diagnosis not present

## 2020-10-05 DIAGNOSIS — E7800 Pure hypercholesterolemia, unspecified: Secondary | ICD-10-CM | POA: Diagnosis not present

## 2020-10-30 ENCOUNTER — Other Ambulatory Visit: Payer: Self-pay | Admitting: Sports Medicine

## 2020-10-30 MED ORDER — GABAPENTIN 300 MG PO CAPS: 300.0000 mg | ORAL_CAPSULE | Freq: Three times a day (TID) | ORAL | 6 refills | Status: DC

## 2020-11-05 IMAGING — MR MR LUMBAR SPINE W/O CM
5 series · 46 of 48 positions shown · non-contrast
Comparison: MRI lumbar spine 08/16/2005

CLINICAL DATA: Chronic low back pain with left-sided sciatica

EXAM:
MRI LUMBAR SPINE WITHOUT CONTRAST
TECHNIQUE: Multiplanar, multisequence MR imaging of the lumbar spine was
performed. No intravenous contrast was administered.

[Series 3: T2 post-contrast · sagittal · 4.0mm · 0.88mm/px · 6 of 13 slices shown]
[im 1/13]
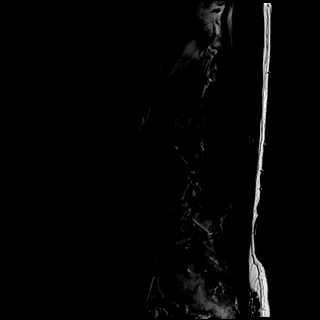
[im 3/13]
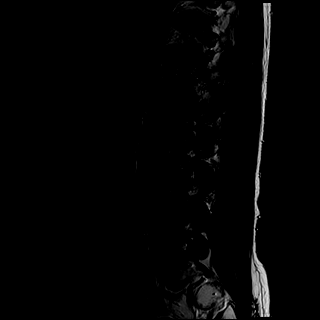
[im 5/13]
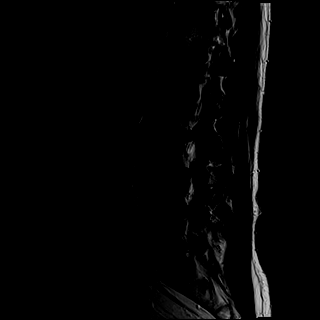
[im 8/13]
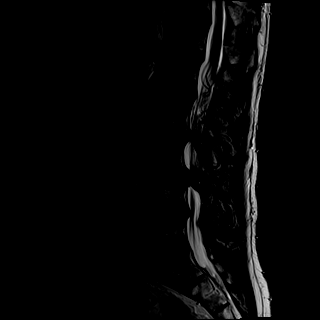
[im 10/13]
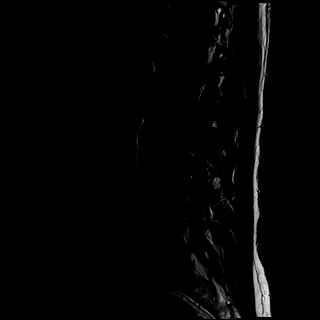
[im 13/13]
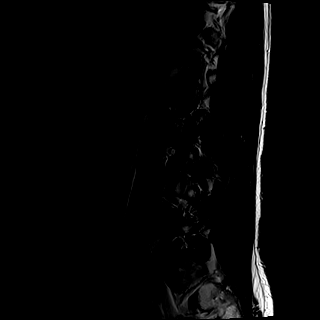

[Series 4: T1 · sagittal · 4.0mm · 0.88mm/px · 5 of 13 slices shown (1 of 2)]
[im 1/13]
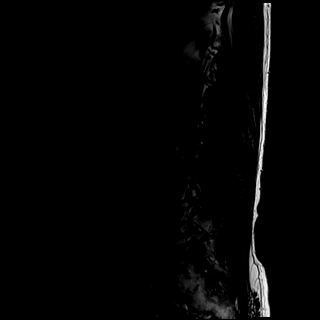
[im 4/13]
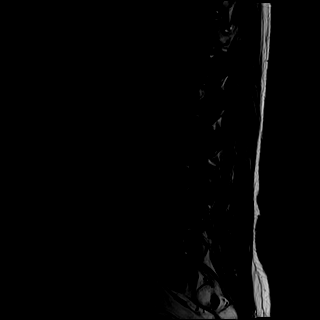
[im 7/13]
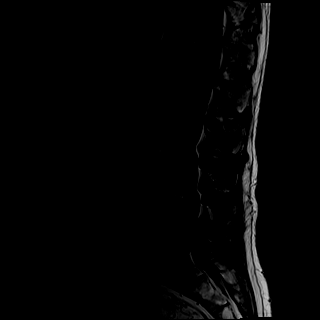
[im 10/13]
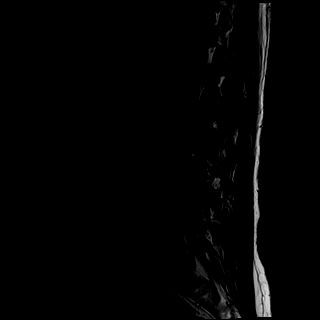
[im 13/13]
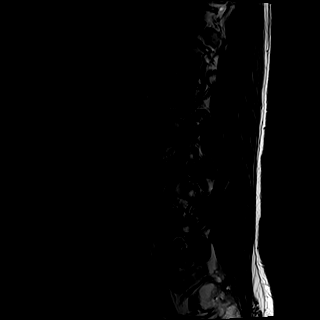

[Series 5: tirm sag · sagittal · 4.0mm · 0.55mm/px · 5 of 13 slices shown]
[im 1/13]
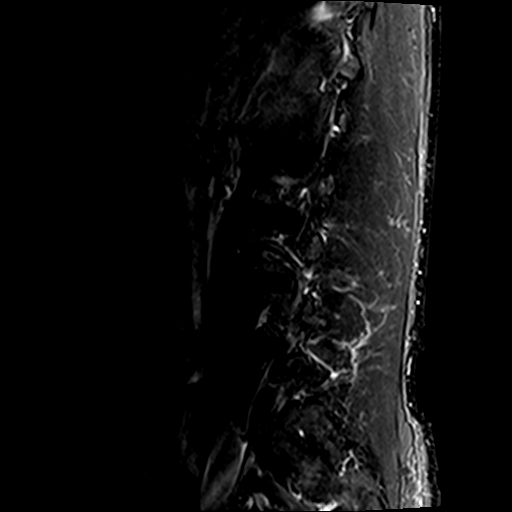
[im 4/13]
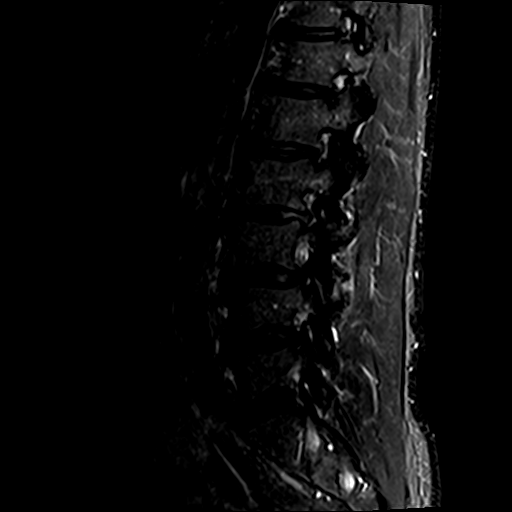
[im 7/13]
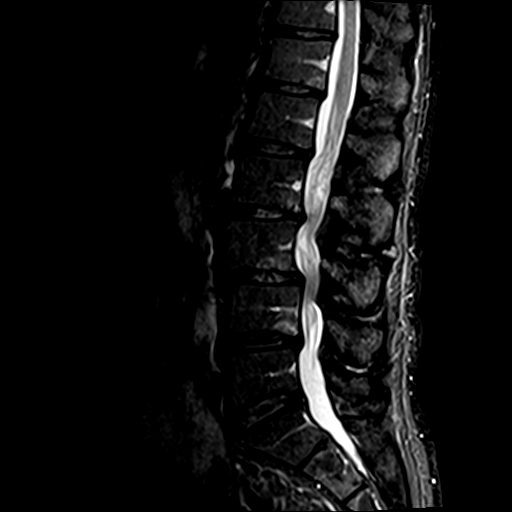
[im 10/13]
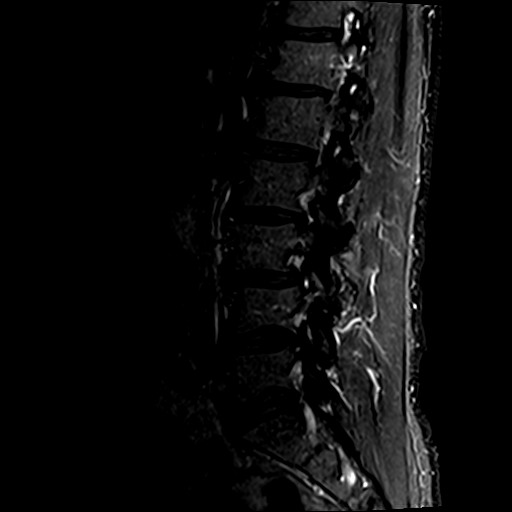
[im 13/13]
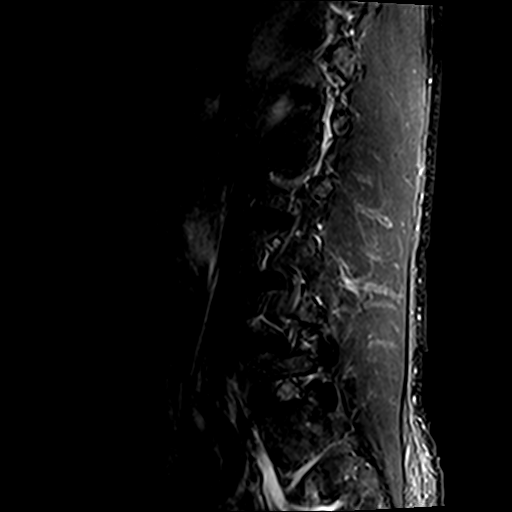

[Series 6: T1 · axial · 4.0mm · 0.78mm/px · z∈[-158,+55]mm · 14 of 38 slices shown (2 of 2)]
[im 1/38]
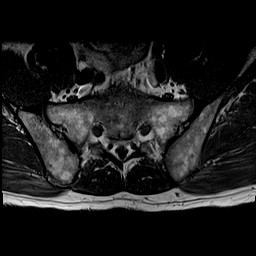
[im 3/38]
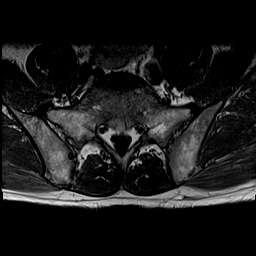
[im 5/38]
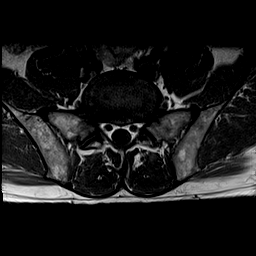
[im 8/38]
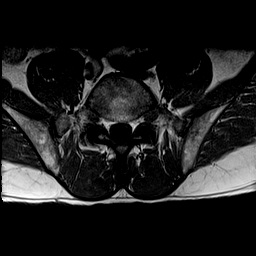
[im 10/38]
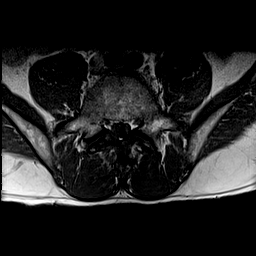
[im 13/38]
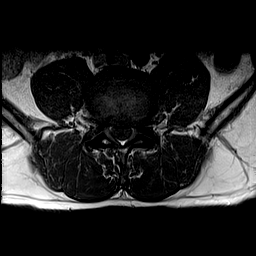
[im 15/38]
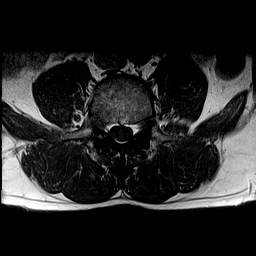
[im 18/38]
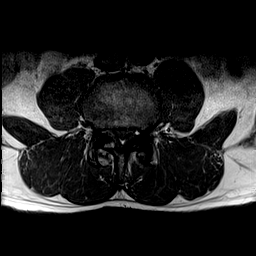
[im 20/38]
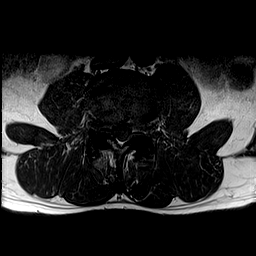
[im 23/38]
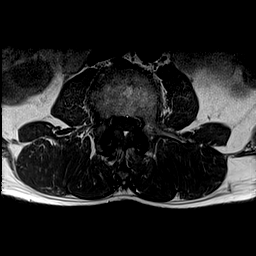
[im 25/38]
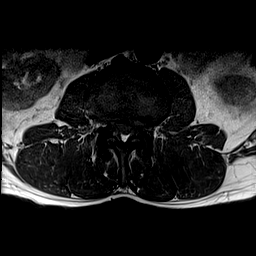
[im 28/38]
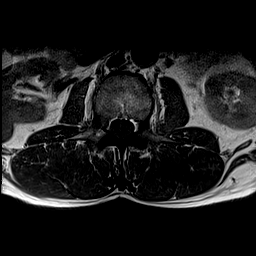
[im 33/38]
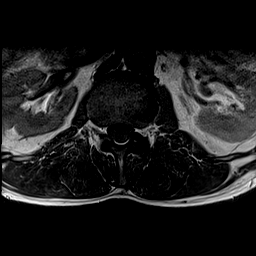
[im 38/38]
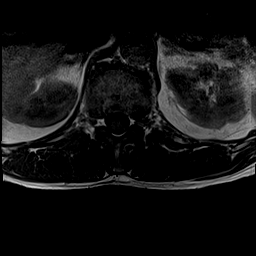

[Series 7: T2 · axial · 4.0mm · 0.78mm/px · z∈[-158,+55]mm · 16 of 38 slices shown]
[im 1/38]
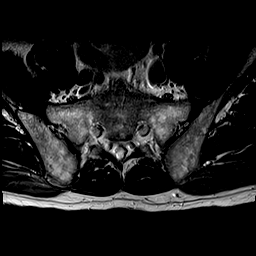
[im 3/38]
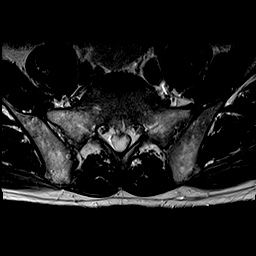
[im 5/38]
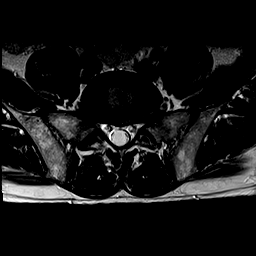
[im 8/38]
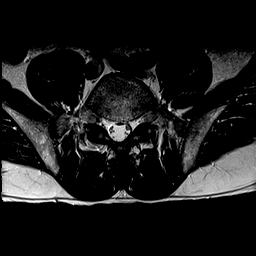
[im 10/38]
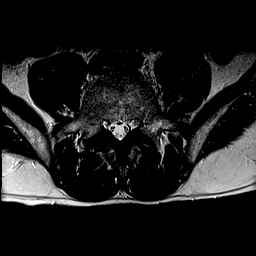
[im 13/38]
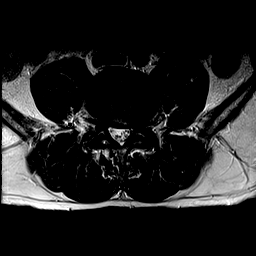
[im 15/38]
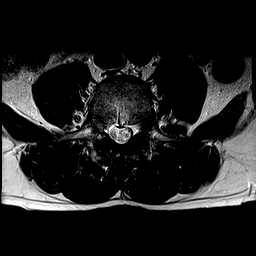
[im 18/38]
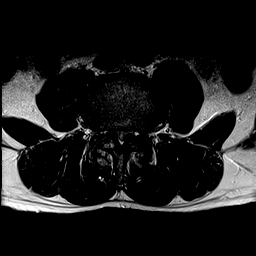
[im 20/38]
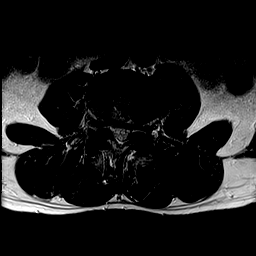
[im 23/38]
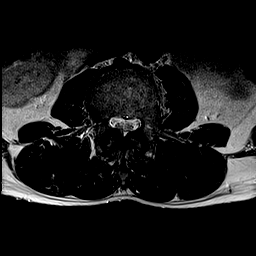
[im 25/38]
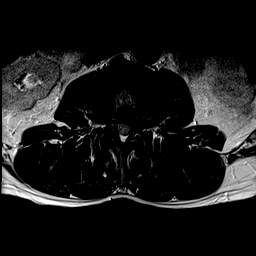
[im 28/38]
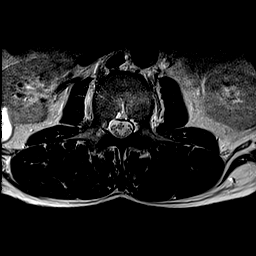
[im 30/38]
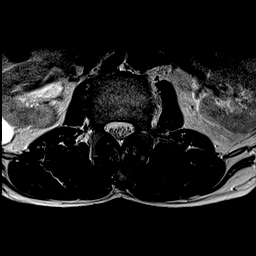
[im 33/38]
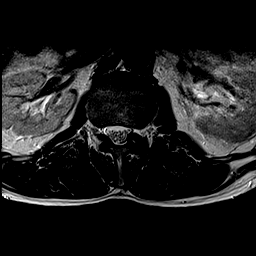
[im 35/38]
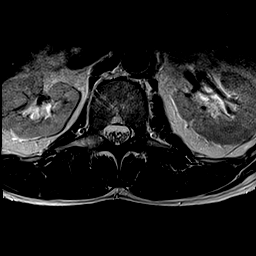
[im 38/38]
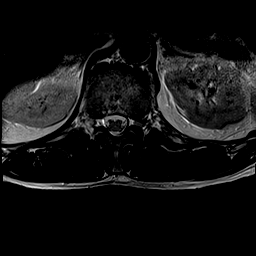

[46 of 48 positions shown; findings below may reference images not displayed]

FINDINGS: Segmentation:  Normal

Alignment: 6 mm anterolisthesis L3-4 has progressed. Mild
retrolisthesis L1-2 unchanged. Mild retrolisthesis L4-5 unchanged.
Remaining alignment normal

Vertebrae:  Negative for fracture or mass.

Conus medullaris and cauda equina: Conus extends to the L1-2 level.
Conus and cauda equina appear normal.

Paraspinal and other soft tissues: Negative for paraspinous mass or
fluid collection.

Disc levels:

L1-2: Mild disc and mild facet degeneration. Mild subarticular
stenosis on the left

L2-3: Disc degeneration with disc bulging and endplate spurring.
Moderate facet hypertrophy bilaterally. New left-sided disc
protrusion with impingement of the left L3 nerve root in the
subarticular zone. Moderate spinal stenosis. Severe subarticular
stenosis on the left and moderate subarticular stenosis on the right

L3-4: 6 mm anterolisthesis. Severe spinal stenosis has progressed.
Diffuse disc bulging and bilateral facet hypertrophy. Severe
subarticular stenosis bilaterally.

L4-5: Disc degeneration with diffuse disc bulging and endplate
spurring. Bilateral facet hypertrophy. Mild spinal stenosis.
Moderate subarticular stenosis left greater than right has
progressed in the interval.

L5-S1: Small left-sided annular fissure and small disc protrusion on
the left. No definite nerve root compression.
IMPRESSION: Multilevel degenerative changes with progression since 3225

New left-sided disc protrusion at L2-3 with impingement left L3
nerve root. Progressive moderate spinal stenosis.

Severe spinal stenosis L3-4 with progression.

Mild spinal stenosis L4-5 with moderate subarticular stenosis left
greater than right

## 2020-12-14 DIAGNOSIS — Z5181 Encounter for therapeutic drug level monitoring: Secondary | ICD-10-CM | POA: Diagnosis not present

## 2020-12-14 DIAGNOSIS — N401 Enlarged prostate with lower urinary tract symptoms: Secondary | ICD-10-CM | POA: Diagnosis not present

## 2020-12-14 DIAGNOSIS — N529 Male erectile dysfunction, unspecified: Secondary | ICD-10-CM | POA: Diagnosis not present

## 2020-12-14 DIAGNOSIS — E7800 Pure hypercholesterolemia, unspecified: Secondary | ICD-10-CM | POA: Diagnosis not present

## 2020-12-14 DIAGNOSIS — L821 Other seborrheic keratosis: Secondary | ICD-10-CM | POA: Diagnosis not present

## 2020-12-14 DIAGNOSIS — Z125 Encounter for screening for malignant neoplasm of prostate: Secondary | ICD-10-CM | POA: Diagnosis not present

## 2020-12-14 DIAGNOSIS — M48061 Spinal stenosis, lumbar region without neurogenic claudication: Secondary | ICD-10-CM | POA: Diagnosis not present

## 2021-01-12 DIAGNOSIS — N400 Enlarged prostate without lower urinary tract symptoms: Secondary | ICD-10-CM | POA: Diagnosis not present

## 2021-01-12 DIAGNOSIS — Z820 Family history of epilepsy and other diseases of the nervous system: Secondary | ICD-10-CM | POA: Diagnosis not present

## 2021-01-12 DIAGNOSIS — M5430 Sciatica, unspecified side: Secondary | ICD-10-CM | POA: Diagnosis not present

## 2021-01-12 DIAGNOSIS — Z8249 Family history of ischemic heart disease and other diseases of the circulatory system: Secondary | ICD-10-CM | POA: Diagnosis not present

## 2021-01-12 DIAGNOSIS — Z791 Long term (current) use of non-steroidal anti-inflammatories (NSAID): Secondary | ICD-10-CM | POA: Diagnosis not present

## 2021-01-12 DIAGNOSIS — G8929 Other chronic pain: Secondary | ICD-10-CM | POA: Diagnosis not present

## 2021-02-18 DIAGNOSIS — U071 COVID-19: Secondary | ICD-10-CM | POA: Diagnosis not present

## 2021-02-18 DIAGNOSIS — R5383 Other fatigue: Secondary | ICD-10-CM | POA: Diagnosis not present

## 2021-03-15 DIAGNOSIS — L720 Epidermal cyst: Secondary | ICD-10-CM | POA: Diagnosis not present

## 2021-03-15 DIAGNOSIS — Z85828 Personal history of other malignant neoplasm of skin: Secondary | ICD-10-CM | POA: Diagnosis not present

## 2021-03-15 DIAGNOSIS — D485 Neoplasm of uncertain behavior of skin: Secondary | ICD-10-CM | POA: Diagnosis not present

## 2021-03-15 DIAGNOSIS — D225 Melanocytic nevi of trunk: Secondary | ICD-10-CM | POA: Diagnosis not present

## 2021-03-15 DIAGNOSIS — L820 Inflamed seborrheic keratosis: Secondary | ICD-10-CM | POA: Diagnosis not present

## 2021-03-15 DIAGNOSIS — D224 Melanocytic nevi of scalp and neck: Secondary | ICD-10-CM | POA: Diagnosis not present

## 2021-03-15 DIAGNOSIS — L821 Other seborrheic keratosis: Secondary | ICD-10-CM | POA: Diagnosis not present

## 2021-03-15 DIAGNOSIS — L819 Disorder of pigmentation, unspecified: Secondary | ICD-10-CM | POA: Diagnosis not present

## 2021-03-15 DIAGNOSIS — C44319 Basal cell carcinoma of skin of other parts of face: Secondary | ICD-10-CM | POA: Diagnosis not present

## 2021-04-12 DIAGNOSIS — Z5181 Encounter for therapeutic drug level monitoring: Secondary | ICD-10-CM | POA: Diagnosis not present

## 2021-04-12 DIAGNOSIS — L821 Other seborrheic keratosis: Secondary | ICD-10-CM | POA: Diagnosis not present

## 2021-04-12 DIAGNOSIS — N529 Male erectile dysfunction, unspecified: Secondary | ICD-10-CM | POA: Diagnosis not present

## 2021-04-12 DIAGNOSIS — Z125 Encounter for screening for malignant neoplasm of prostate: Secondary | ICD-10-CM | POA: Diagnosis not present

## 2021-04-12 DIAGNOSIS — N401 Enlarged prostate with lower urinary tract symptoms: Secondary | ICD-10-CM | POA: Diagnosis not present

## 2021-04-12 DIAGNOSIS — M48061 Spinal stenosis, lumbar region without neurogenic claudication: Secondary | ICD-10-CM | POA: Diagnosis not present

## 2021-04-12 DIAGNOSIS — E7800 Pure hypercholesterolemia, unspecified: Secondary | ICD-10-CM | POA: Diagnosis not present

## 2021-04-13 DIAGNOSIS — C44319 Basal cell carcinoma of skin of other parts of face: Secondary | ICD-10-CM | POA: Diagnosis not present

## 2021-05-02 ENCOUNTER — Encounter: Payer: Self-pay | Admitting: Gastroenterology

## 2021-10-12 DIAGNOSIS — M4316 Spondylolisthesis, lumbar region: Secondary | ICD-10-CM | POA: Diagnosis not present

## 2021-10-12 DIAGNOSIS — N401 Enlarged prostate with lower urinary tract symptoms: Secondary | ICD-10-CM | POA: Diagnosis not present

## 2021-10-12 DIAGNOSIS — M169 Osteoarthritis of hip, unspecified: Secondary | ICD-10-CM | POA: Diagnosis not present

## 2021-10-12 DIAGNOSIS — Z820 Family history of epilepsy and other diseases of the nervous system: Secondary | ICD-10-CM | POA: Diagnosis not present

## 2021-10-12 DIAGNOSIS — H9193 Unspecified hearing loss, bilateral: Secondary | ICD-10-CM | POA: Diagnosis not present

## 2021-10-12 DIAGNOSIS — M48062 Spinal stenosis, lumbar region with neurogenic claudication: Secondary | ICD-10-CM | POA: Diagnosis not present

## 2021-10-12 DIAGNOSIS — Z8249 Family history of ischemic heart disease and other diseases of the circulatory system: Secondary | ICD-10-CM | POA: Diagnosis not present

## 2021-10-12 DIAGNOSIS — R82998 Other abnormal findings in urine: Secondary | ICD-10-CM | POA: Diagnosis not present

## 2021-10-12 DIAGNOSIS — Z0000 Encounter for general adult medical examination without abnormal findings: Secondary | ICD-10-CM | POA: Diagnosis not present

## 2021-10-12 DIAGNOSIS — Z1331 Encounter for screening for depression: Secondary | ICD-10-CM | POA: Diagnosis not present

## 2021-10-12 DIAGNOSIS — Z1339 Encounter for screening examination for other mental health and behavioral disorders: Secondary | ICD-10-CM | POA: Diagnosis not present

## 2021-10-12 DIAGNOSIS — E7800 Pure hypercholesterolemia, unspecified: Secondary | ICD-10-CM | POA: Diagnosis not present

## 2021-10-12 DIAGNOSIS — N529 Male erectile dysfunction, unspecified: Secondary | ICD-10-CM | POA: Diagnosis not present

## 2021-10-12 DIAGNOSIS — Z96649 Presence of unspecified artificial hip joint: Secondary | ICD-10-CM | POA: Diagnosis not present

## 2021-10-16 DIAGNOSIS — Z0000 Encounter for general adult medical examination without abnormal findings: Secondary | ICD-10-CM | POA: Diagnosis not present

## 2021-10-26 DIAGNOSIS — H5213 Myopia, bilateral: Secondary | ICD-10-CM | POA: Diagnosis not present

## 2021-11-10 ENCOUNTER — Other Ambulatory Visit: Payer: Self-pay | Admitting: Sports Medicine

## 2021-11-18 DIAGNOSIS — M48062 Spinal stenosis, lumbar region with neurogenic claudication: Secondary | ICD-10-CM | POA: Diagnosis not present

## 2021-11-18 DIAGNOSIS — M5412 Radiculopathy, cervical region: Secondary | ICD-10-CM | POA: Diagnosis not present

## 2021-12-14 DIAGNOSIS — M5412 Radiculopathy, cervical region: Secondary | ICD-10-CM | POA: Diagnosis not present

## 2021-12-14 DIAGNOSIS — M4316 Spondylolisthesis, lumbar region: Secondary | ICD-10-CM | POA: Diagnosis not present

## 2021-12-14 DIAGNOSIS — M545 Low back pain, unspecified: Secondary | ICD-10-CM | POA: Diagnosis not present

## 2021-12-21 DIAGNOSIS — M4316 Spondylolisthesis, lumbar region: Secondary | ICD-10-CM | POA: Diagnosis not present

## 2021-12-21 DIAGNOSIS — M5412 Radiculopathy, cervical region: Secondary | ICD-10-CM | POA: Diagnosis not present

## 2021-12-21 DIAGNOSIS — M545 Low back pain, unspecified: Secondary | ICD-10-CM | POA: Diagnosis not present

## 2021-12-28 DIAGNOSIS — M545 Low back pain, unspecified: Secondary | ICD-10-CM | POA: Diagnosis not present

## 2021-12-28 DIAGNOSIS — M4316 Spondylolisthesis, lumbar region: Secondary | ICD-10-CM | POA: Diagnosis not present

## 2021-12-28 DIAGNOSIS — M5412 Radiculopathy, cervical region: Secondary | ICD-10-CM | POA: Diagnosis not present

## 2022-01-04 DIAGNOSIS — M545 Low back pain, unspecified: Secondary | ICD-10-CM | POA: Diagnosis not present

## 2022-01-04 DIAGNOSIS — M5412 Radiculopathy, cervical region: Secondary | ICD-10-CM | POA: Diagnosis not present

## 2022-01-04 DIAGNOSIS — M4316 Spondylolisthesis, lumbar region: Secondary | ICD-10-CM | POA: Diagnosis not present

## 2022-01-10 DIAGNOSIS — M545 Low back pain, unspecified: Secondary | ICD-10-CM | POA: Diagnosis not present

## 2022-01-10 DIAGNOSIS — M5412 Radiculopathy, cervical region: Secondary | ICD-10-CM | POA: Diagnosis not present

## 2022-01-10 DIAGNOSIS — M4316 Spondylolisthesis, lumbar region: Secondary | ICD-10-CM | POA: Diagnosis not present

## 2022-01-23 DIAGNOSIS — M545 Low back pain, unspecified: Secondary | ICD-10-CM | POA: Diagnosis not present

## 2022-01-23 DIAGNOSIS — M4316 Spondylolisthesis, lumbar region: Secondary | ICD-10-CM | POA: Diagnosis not present

## 2022-01-23 DIAGNOSIS — M5412 Radiculopathy, cervical region: Secondary | ICD-10-CM | POA: Diagnosis not present

## 2022-02-09 DIAGNOSIS — M5412 Radiculopathy, cervical region: Secondary | ICD-10-CM | POA: Diagnosis not present

## 2022-02-09 DIAGNOSIS — M545 Low back pain, unspecified: Secondary | ICD-10-CM | POA: Diagnosis not present

## 2022-02-09 DIAGNOSIS — R202 Paresthesia of skin: Secondary | ICD-10-CM | POA: Diagnosis not present

## 2022-02-09 DIAGNOSIS — M4316 Spondylolisthesis, lumbar region: Secondary | ICD-10-CM | POA: Diagnosis not present

## 2022-02-09 DIAGNOSIS — R200 Anesthesia of skin: Secondary | ICD-10-CM | POA: Diagnosis not present

## 2022-02-10 ENCOUNTER — Other Ambulatory Visit: Payer: Self-pay | Admitting: Internal Medicine

## 2022-02-10 DIAGNOSIS — Z8249 Family history of ischemic heart disease and other diseases of the circulatory system: Secondary | ICD-10-CM

## 2022-02-16 DIAGNOSIS — M4316 Spondylolisthesis, lumbar region: Secondary | ICD-10-CM | POA: Diagnosis not present

## 2022-02-16 DIAGNOSIS — Z6825 Body mass index (BMI) 25.0-25.9, adult: Secondary | ICD-10-CM | POA: Diagnosis not present

## 2022-02-27 HISTORY — PX: BACK SURGERY: SHX140

## 2022-03-07 ENCOUNTER — Other Ambulatory Visit: Payer: Self-pay | Admitting: Neurological Surgery

## 2022-03-14 ENCOUNTER — Encounter (HOSPITAL_COMMUNITY): Payer: Self-pay | Admitting: Neurological Surgery

## 2022-03-14 ENCOUNTER — Other Ambulatory Visit: Payer: Self-pay

## 2022-03-14 NOTE — Pre-Procedure Instructions (Signed)
PCP - Dr. Domenick Gong Cardiologist - denies  PPM/ICD - denies   Chest x-ray - denies EKG - 03/02/11 Stress Test - denies ECHO - denies Cardiac Cath - denies  CPAP - denies  DM- denies  ASA/Blood Thinner Instructions: n/a   ERAS Protcol - clears until 0430  COVID TEST- n/a  Anesthesia review: no  Patient verbally denies any shortness of breath, fever, cough and chest pain during phone call   -------------  SDW INSTRUCTIONS given:  Your procedure is scheduled on 1/18.  Report to [facility] Main Entrance "A" at 0530 A.M., and check in at the Admitting office.  Call this number if you have problems the morning of surgery:  [phone]   Remember:  Do not eat after midnight the night before your surgery  You may drink clear liquids until 0430 AM the morning of your surgery.   Clear liquids allowed are: Water, Non-Citrus Juices (without pulp), Carbonated Beverages, Clear Tea, Black Coffee Only, and Gatorade    Take these medicines the morning of surgery with A SIP OF WATER  finasteride (PROSCAR)  gabapentin (NEURONTIN)  traMADol (ULTRAM)    As of today, STOP taking any Aspirin (unless otherwise instructed by your surgeon) Aleve, Naproxen, Ibuprofen, Motrin, Advil, Goody's, BC's, all herbal medications, fish oil, and all vitamins.                      Do not wear jewelry,             Do not wear lotions, powders, colognes, or deodorant.            Men may shave face and neck.            Do not bring valuables to the hospital.            [facility] is not responsible for any belongings or valuables.  Do NOT Smoke (Tobacco/Vaping) 24 hours prior to your procedure If you use a CPAP at night, you may bring all equipment for your overnight stay.   Contacts, glasses, dentures or bridgework may not be worn into surgery.      For patients admitted to the hospital, discharge time will be determined by your treatment team.   Patients discharged the day of surgery  will not be allowed to drive home, and someone needs to stay with them for 24 hours.    Special instructions:   [place]- Preparing For Surgery  Before surgery, you can play an important role. Because skin is not sterile, your skin needs to be as free of germs as possible. You can reduce the number of germs on your skin by washing with CHG (chlorahexidine gluconate) Soap before surgery.  CHG is an antiseptic cleaner which kills germs and bonds with the skin to continue killing germs even after washing.    Oral Hygiene is also important to reduce your risk of infection.  Remember - BRUSH YOUR TEETH THE MORNING OF SURGERY WITH YOUR REGULAR TOOTHPASTE  Please do not use if you have an allergy to CHG or antibacterial soaps. If your skin becomes reddened/irritated stop using the CHG.  Do not shave (including legs and underarms) for at least 48 hours prior to first CHG shower. It is OK to shave your face.  Please follow these instructions carefully.   Shower the NIGHT BEFORE SURGERY and the MORNING OF SURGERY with DIAL Soap.   Pat yourself dry with a CLEAN TOWEL.  Ali Chuk  to bed the night before surgery  Place CLEAN SHEETS on your bed the night of your first shower and DO NOT SLEEP WITH PETS.   Day of Surgery: Please shower morning of surgery  Wear Clean/Comfortable clothing the morning of surgery Do not apply any deodorants/lotions.   Remember to brush your teeth WITH YOUR REGULAR TOOTHPASTE.   Questions were answered. Patient verbalized understanding of instructions.

## 2022-03-16 ENCOUNTER — Ambulatory Visit (HOSPITAL_COMMUNITY): Payer: Medicare HMO | Admitting: Anesthesiology

## 2022-03-16 ENCOUNTER — Ambulatory Visit (HOSPITAL_COMMUNITY): Payer: Medicare HMO

## 2022-03-16 ENCOUNTER — Inpatient Hospital Stay (HOSPITAL_COMMUNITY)
Admission: AD | Disposition: A | Discharge status: Home / Self Care | Payer: Self-pay | Source: Ambulatory Visit | Attending: Neurological Surgery

## 2022-03-16 ENCOUNTER — Observation Stay (HOSPITAL_COMMUNITY)
Admission: AD | Admit: 2022-03-16 | Discharge: 2022-03-17 | Disposition: A | Discharge status: Home / Self Care | Payer: Medicare HMO | Source: Ambulatory Visit | Attending: Neurological Surgery | Admitting: Neurological Surgery

## 2022-03-16 ENCOUNTER — Other Ambulatory Visit: Payer: Self-pay

## 2022-03-16 ENCOUNTER — Encounter (HOSPITAL_COMMUNITY): Payer: Self-pay | Admitting: Neurological Surgery

## 2022-03-16 DIAGNOSIS — Z79899 Other long term (current) drug therapy: Secondary | ICD-10-CM | POA: Insufficient documentation

## 2022-03-16 DIAGNOSIS — M5416 Radiculopathy, lumbar region: Secondary | ICD-10-CM | POA: Diagnosis not present

## 2022-03-16 DIAGNOSIS — M48062 Spinal stenosis, lumbar region with neurogenic claudication: Secondary | ICD-10-CM | POA: Diagnosis not present

## 2022-03-16 DIAGNOSIS — M4316 Spondylolisthesis, lumbar region: Principal | ICD-10-CM

## 2022-03-16 DIAGNOSIS — Z85828 Personal history of other malignant neoplasm of skin: Secondary | ICD-10-CM | POA: Insufficient documentation

## 2022-03-16 DIAGNOSIS — Z96642 Presence of left artificial hip joint: Secondary | ICD-10-CM | POA: Insufficient documentation

## 2022-03-16 LAB — TYPE AND SCREEN
ABO/RH(D): A POS
Antibody Screen: NEGATIVE

## 2022-03-16 LAB — SURGICAL PCR SCREEN
MRSA, PCR: NEGATIVE
Staphylococcus aureus: NEGATIVE

## 2022-03-16 SURGERY — POSTERIOR LUMBAR FUSION 1 LEVEL
Anesthesia: General | Site: Back

## 2022-03-16 MED ORDER — FLEET ENEMA 7-19 GM/118ML RE ENEM: 1.0000 | ENEMA | Freq: Once | RECTAL | Status: DC | PRN

## 2022-03-16 MED ORDER — ORAL CARE MOUTH RINSE: 15.0000 mL | Freq: Once | OROMUCOSAL | Status: AC

## 2022-03-16 MED ORDER — FINASTERIDE 5 MG PO TABS
5.0000 mg | ORAL_TABLET | Freq: Every day | ORAL | Status: DC
  Administered 2022-03-16: 5 mg via ORAL
  Filled 2022-03-16: qty 1

## 2022-03-16 MED ORDER — BISACODYL 10 MG RE SUPP: 10.0000 mg | Freq: Every day | RECTAL | Status: DC | PRN

## 2022-03-16 MED ORDER — ACETAMINOPHEN 325 MG PO TABS: 650.0000 mg | ORAL_TABLET | ORAL | Status: DC | PRN

## 2022-03-16 MED ORDER — OXYCODONE HCL 5 MG PO TABS: 5.0000 mg | ORAL_TABLET | Freq: Once | ORAL | Status: DC | PRN

## 2022-03-16 MED ORDER — POLYETHYLENE GLYCOL 3350 17 G PO PACK: 17.0000 g | PACK | Freq: Every day | ORAL | Status: DC | PRN

## 2022-03-16 MED ORDER — LIDOCAINE 2% (20 MG/ML) 5 ML SYRINGE
INTRAMUSCULAR | Status: AC
  Filled 2022-03-16: qty 5

## 2022-03-16 MED ORDER — SODIUM CHLORIDE 0.9 % IV SOLN: INTRAVENOUS | Status: DC | PRN

## 2022-03-16 MED ORDER — CHLORHEXIDINE GLUCONATE 0.12 % MT SOLN
15.0000 mL | Freq: Once | OROMUCOSAL | Status: AC
  Administered 2022-03-16: 15 mL via OROMUCOSAL
  Filled 2022-03-16: qty 15

## 2022-03-16 MED ORDER — SODIUM CHLORIDE 0.9% FLUSH: 3.0000 mL | INTRAVENOUS | Status: DC | PRN

## 2022-03-16 MED ORDER — BUPIVACAINE HCL (PF) 0.5 % IJ SOLN
INTRAMUSCULAR | Status: AC
  Filled 2022-03-16: qty 30

## 2022-03-16 MED ORDER — ROCURONIUM BROMIDE 10 MG/ML (PF) SYRINGE
PREFILLED_SYRINGE | INTRAVENOUS | Status: DC | PRN
  Administered 2022-03-16 (×3): 10 mg via INTRAVENOUS
  Administered 2022-03-16: 60 mg via INTRAVENOUS
  Administered 2022-03-16 (×3): 10 mg via INTRAVENOUS

## 2022-03-16 MED ORDER — CEFAZOLIN SODIUM-DEXTROSE 2-4 GM/100ML-% IV SOLN
2.0000 g | INTRAVENOUS | Status: AC
  Administered 2022-03-16: 2 g via INTRAVENOUS
  Filled 2022-03-16: qty 100

## 2022-03-16 MED ORDER — ONDANSETRON HCL 4 MG/2ML IJ SOLN: 4.0000 mg | Freq: Four times a day (QID) | INTRAMUSCULAR | Status: DC | PRN

## 2022-03-16 MED ORDER — EPHEDRINE SULFATE-NACL 50-0.9 MG/10ML-% IV SOSY
PREFILLED_SYRINGE | INTRAVENOUS | Status: DC | PRN
  Administered 2022-03-16: 5 mg via INTRAVENOUS

## 2022-03-16 MED ORDER — OXYCODONE HCL 5 MG/5ML PO SOLN: 5.0000 mg | Freq: Once | ORAL | Status: DC | PRN

## 2022-03-16 MED ORDER — LACTATED RINGERS IV SOLN: INTRAVENOUS | Status: DC

## 2022-03-16 MED ORDER — ONDANSETRON HCL 4 MG PO TABS: 4.0000 mg | ORAL_TABLET | Freq: Four times a day (QID) | ORAL | Status: DC | PRN

## 2022-03-16 MED ORDER — 0.9 % SODIUM CHLORIDE (POUR BTL) OPTIME
TOPICAL | Status: DC | PRN
  Administered 2022-03-16: 1000 mL

## 2022-03-16 MED ORDER — SUFENTANIL CITRATE 50 MCG/ML IV SOLN
INTRAVENOUS | Status: DC | PRN
  Administered 2022-03-16 (×2): 10 ug via INTRAVENOUS
  Administered 2022-03-16: 15 ug via INTRAVENOUS
  Administered 2022-03-16: 10 ug via INTRAVENOUS
  Administered 2022-03-16: 5 ug via INTRAVENOUS

## 2022-03-16 MED ORDER — DEXAMETHASONE SODIUM PHOSPHATE 10 MG/ML IJ SOLN
INTRAMUSCULAR | Status: AC
  Filled 2022-03-16: qty 1

## 2022-03-16 MED ORDER — SODIUM CHLORIDE 0.9% FLUSH
3.0000 mL | Freq: Two times a day (BID) | INTRAVENOUS | Status: DC
  Administered 2022-03-16: 3 mL via INTRAVENOUS

## 2022-03-16 MED ORDER — DEXAMETHASONE SODIUM PHOSPHATE 10 MG/ML IJ SOLN
INTRAMUSCULAR | Status: DC | PRN
  Administered 2022-03-16 (×2): 5 mg via INTRAVENOUS

## 2022-03-16 MED ORDER — LIDOCAINE-EPINEPHRINE 1 %-1:100000 IJ SOLN
INTRAMUSCULAR | Status: AC
  Filled 2022-03-16: qty 1

## 2022-03-16 MED ORDER — PHENYLEPHRINE 80 MCG/ML (10ML) SYRINGE FOR IV PUSH (FOR BLOOD PRESSURE SUPPORT)
PREFILLED_SYRINGE | INTRAVENOUS | Status: AC
  Filled 2022-03-16: qty 10

## 2022-03-16 MED ORDER — THROMBIN 5000 UNITS EX SOLR: OROMUCOSAL | Status: DC | PRN

## 2022-03-16 MED ORDER — LIDOCAINE-EPINEPHRINE 1 %-1:100000 IJ SOLN
INTRAMUSCULAR | Status: DC | PRN
  Administered 2022-03-16: 20 mL via INTRADERMAL

## 2022-03-16 MED ORDER — SODIUM CHLORIDE 0.9 % IV SOLN: 250.0000 mL | INTRAVENOUS | Status: DC

## 2022-03-16 MED ORDER — ONDANSETRON HCL 4 MG/2ML IJ SOLN
INTRAMUSCULAR | Status: DC | PRN
  Administered 2022-03-16: 4 mg via INTRAVENOUS

## 2022-03-16 MED ORDER — PROPOFOL 10 MG/ML IV BOLUS
INTRAVENOUS | Status: AC
  Filled 2022-03-16: qty 20

## 2022-03-16 MED ORDER — ONDANSETRON HCL 4 MG/2ML IJ SOLN: 4.0000 mg | Freq: Once | INTRAMUSCULAR | Status: DC | PRN

## 2022-03-16 MED ORDER — PHENOL 1.4 % MT LIQD: 1.0000 | OROMUCOSAL | Status: DC | PRN

## 2022-03-16 MED ORDER — SODIUM CHLORIDE (PF) 0.9 % IJ SOLN
INTRAMUSCULAR | Status: AC
  Filled 2022-03-16: qty 10

## 2022-03-16 MED ORDER — BUPIVACAINE HCL (PF) 0.5 % IJ SOLN
INTRAMUSCULAR | Status: DC | PRN
  Administered 2022-03-16: 30 mL

## 2022-03-16 MED ORDER — ROCURONIUM BROMIDE 10 MG/ML (PF) SYRINGE
PREFILLED_SYRINGE | INTRAVENOUS | Status: AC
  Filled 2022-03-16: qty 10

## 2022-03-16 MED ORDER — DOCUSATE SODIUM 100 MG PO CAPS
100.0000 mg | ORAL_CAPSULE | Freq: Two times a day (BID) | ORAL | Status: DC
  Administered 2022-03-16: 100 mg via ORAL
  Filled 2022-03-16: qty 1

## 2022-03-16 MED ORDER — HYDROMORPHONE HCL 1 MG/ML IJ SOLN: 0.2500 mg | INTRAMUSCULAR | Status: DC | PRN

## 2022-03-16 MED ORDER — SENNA 8.6 MG PO TABS
1.0000 | ORAL_TABLET | Freq: Two times a day (BID) | ORAL | Status: DC
  Administered 2022-03-16: 8.6 mg via ORAL
  Filled 2022-03-16: qty 1

## 2022-03-16 MED ORDER — MENTHOL 3 MG MT LOZG: 1.0000 | LOZENGE | OROMUCOSAL | Status: DC | PRN

## 2022-03-16 MED ORDER — CEFAZOLIN SODIUM-DEXTROSE 2-4 GM/100ML-% IV SOLN
2.0000 g | Freq: Three times a day (TID) | INTRAVENOUS | Status: AC
  Administered 2022-03-16 (×2): 2 g via INTRAVENOUS
  Filled 2022-03-16 (×2): qty 100

## 2022-03-16 MED ORDER — METHOCARBAMOL 500 MG PO TABS
500.0000 mg | ORAL_TABLET | Freq: Four times a day (QID) | ORAL | Status: DC | PRN
  Administered 2022-03-16: 500 mg via ORAL
  Filled 2022-03-16 (×2): qty 1

## 2022-03-16 MED ORDER — ACETAMINOPHEN 650 MG RE SUPP: 650.0000 mg | RECTAL | Status: DC | PRN

## 2022-03-16 MED ORDER — TRAMADOL HCL 50 MG PO TABS
50.0000 mg | ORAL_TABLET | Freq: Two times a day (BID) | ORAL | Status: DC
  Filled 2022-03-16: qty 1

## 2022-03-16 MED ORDER — THROMBIN 5000 UNITS EX SOLR
CUTANEOUS | Status: AC
  Filled 2022-03-16: qty 5000

## 2022-03-16 MED ORDER — CHLORHEXIDINE GLUCONATE CLOTH 2 % EX PADS: 6.0000 | MEDICATED_PAD | Freq: Once | CUTANEOUS | Status: DC

## 2022-03-16 MED ORDER — GABAPENTIN 300 MG PO CAPS
300.0000 mg | ORAL_CAPSULE | Freq: Two times a day (BID) | ORAL | Status: DC
  Administered 2022-03-16 (×2): 300 mg via ORAL
  Filled 2022-03-16 (×2): qty 1

## 2022-03-16 MED ORDER — SUFENTANIL CITRATE 50 MCG/ML IV SOLN
INTRAVENOUS | Status: AC
  Filled 2022-03-16: qty 1

## 2022-03-16 MED ORDER — LIDOCAINE 2% (20 MG/ML) 5 ML SYRINGE
INTRAMUSCULAR | Status: DC | PRN
  Administered 2022-03-16: 60 mg via INTRAVENOUS

## 2022-03-16 MED ORDER — METHOCARBAMOL 1000 MG/10ML IJ SOLN: 500.0000 mg | Freq: Four times a day (QID) | INTRAVENOUS | Status: DC | PRN

## 2022-03-16 MED ORDER — PROPOFOL 10 MG/ML IV BOLUS
INTRAVENOUS | Status: DC | PRN
  Administered 2022-03-16: 140 mg via INTRAVENOUS

## 2022-03-16 MED ORDER — OXYCODONE-ACETAMINOPHEN 5-325 MG PO TABS
1.0000 | ORAL_TABLET | ORAL | Status: DC | PRN
  Administered 2022-03-16: 1 via ORAL
  Administered 2022-03-16 – 2022-03-17 (×3): 2 via ORAL
  Filled 2022-03-16 (×4): qty 2

## 2022-03-16 MED ORDER — MIDAZOLAM HCL 2 MG/2ML IJ SOLN
INTRAMUSCULAR | Status: AC
  Filled 2022-03-16: qty 2

## 2022-03-16 MED ORDER — EPHEDRINE 5 MG/ML INJ
INTRAVENOUS | Status: AC
  Filled 2022-03-16: qty 5

## 2022-03-16 MED ORDER — MIDAZOLAM HCL 2 MG/2ML IJ SOLN
INTRAMUSCULAR | Status: DC | PRN
  Administered 2022-03-16: 2 mg via INTRAVENOUS

## 2022-03-16 MED ORDER — THROMBIN (RECOMBINANT) 5000 UNITS EX SOLR
CUTANEOUS | Status: AC
  Filled 2022-03-16: qty 5000

## 2022-03-16 MED ORDER — MORPHINE SULFATE (PF) 2 MG/ML IV SOLN: 2.0000 mg | INTRAVENOUS | Status: DC | PRN

## 2022-03-16 SURGICAL SUPPLY — 67 items
ADH SKN CLS APL DERMABOND .7 (GAUZE/BANDAGES/DRESSINGS) ×1
APL SRG 60D 8 XTD TIP BNDBL (TIP)
BAG COUNTER SPONGE SURGICOUNT (BAG) ×2 IMPLANT
BAG SPNG CNTER NS LX DISP (BAG) ×1
BASKET BONE COLLECTION (BASKET) ×2 IMPLANT
BLADE BONE MILL MEDIUM (MISCELLANEOUS) ×2 IMPLANT
BLADE CLIPPER SURG (BLADE) IMPLANT
BONE CANC CHIPS 20CC PCAN1/4 (Bone Implant) ×1 IMPLANT
BUR MATCHSTICK NEURO 3.0 LAGG (BURR) ×2 IMPLANT
CAGE COROENT LG 10X9X23-12 (Cage) IMPLANT
CANISTER SUCT 3000ML PPV (MISCELLANEOUS) ×2 IMPLANT
CHIPS CANC BONE 20CC PCAN1/4 (Bone Implant) ×1 IMPLANT
CNTNR URN SCR LID CUP LEK RST (MISCELLANEOUS) ×2 IMPLANT
CONT SPEC 4OZ STRL OR WHT (MISCELLANEOUS) ×1
COVER BACK TABLE 60X90IN (DRAPES) ×2 IMPLANT
DERMABOND ADVANCED .7 DNX12 (GAUZE/BANDAGES/DRESSINGS) ×2 IMPLANT
DEVICE DISSECT PLASMABLAD 3.0S (MISCELLANEOUS) ×2 IMPLANT
DRAPE C-ARM 42X72 X-RAY (DRAPES) ×4 IMPLANT
DRAPE C-ARMOR (DRAPES) IMPLANT
DRAPE HALF SHEET 40X57 (DRAPES) IMPLANT
DRAPE LAPAROTOMY 100X72X124 (DRAPES) ×2 IMPLANT
DRSG OPSITE POSTOP 4X8 (GAUZE/BANDAGES/DRESSINGS) IMPLANT
DURAPREP 26ML APPLICATOR (WOUND CARE) ×2 IMPLANT
DURASEAL APPLICATOR TIP (TIP) IMPLANT
DURASEAL SPINE SEALANT 3ML (MISCELLANEOUS) IMPLANT
ELECT REM PT RETURN 9FT ADLT (ELECTROSURGICAL) ×1
ELECTRODE REM PT RTRN 9FT ADLT (ELECTROSURGICAL) ×2 IMPLANT
GAUZE 4X4 16PLY ~~LOC~~+RFID DBL (SPONGE) IMPLANT
GAUZE SPONGE 4X4 12PLY STRL (GAUZE/BANDAGES/DRESSINGS) ×2 IMPLANT
GLOVE BIO SURGEON STRL SZ 6.5 (GLOVE) IMPLANT
GLOVE BIOGEL PI IND STRL 8.5 (GLOVE) ×4 IMPLANT
GLOVE ECLIPSE 8.5 STRL (GLOVE) ×4 IMPLANT
GOWN STRL REUS W/ TWL LRG LVL3 (GOWN DISPOSABLE) IMPLANT
GOWN STRL REUS W/ TWL XL LVL3 (GOWN DISPOSABLE) IMPLANT
GOWN STRL REUS W/TWL 2XL LVL3 (GOWN DISPOSABLE) ×4 IMPLANT
GOWN STRL REUS W/TWL LRG LVL3 (GOWN DISPOSABLE)
GOWN STRL REUS W/TWL XL LVL3 (GOWN DISPOSABLE)
GRAFT BNE CANC CHIPS 1-8 20CC (Bone Implant) IMPLANT
GRAFT BONE PROTEIOS LRG 5CC (Orthopedic Implant) IMPLANT
HEMOSTAT POWDER KIT SURGIFOAM (HEMOSTASIS) ×2 IMPLANT
KIT BASIN OR (CUSTOM PROCEDURE TRAY) ×2 IMPLANT
KIT GRAFTMAG DEL NEURO DISP (NEUROSURGERY SUPPLIES) IMPLANT
KIT TURNOVER KIT B (KITS) ×2 IMPLANT
MILL BONE PREP (MISCELLANEOUS) ×2 IMPLANT
NEEDLE HYPO 22GX1.5 SAFETY (NEEDLE) ×2 IMPLANT
NS IRRIG 1000ML POUR BTL (IV SOLUTION) ×2 IMPLANT
PACK LAMINECTOMY NEURO (CUSTOM PROCEDURE TRAY) ×2 IMPLANT
PAD ARMBOARD 7.5X6 YLW CONV (MISCELLANEOUS) ×6 IMPLANT
PATTIES SURGICAL .5 X1 (DISPOSABLE) ×2 IMPLANT
PLASMABLADE 3.0S (MISCELLANEOUS) ×1
ROD RELINE-O LORD 5.5X40 (Rod) IMPLANT
SCREW LOCK RELINE 5.5 TULIP (Screw) IMPLANT
SCREW RELINE-O POLY 6.5X45 (Screw) IMPLANT
SPIKE FLUID TRANSFER (MISCELLANEOUS) ×2 IMPLANT
SPONGE SURGIFOAM ABS GEL 100 (HEMOSTASIS) IMPLANT
SPONGE T-LAP 4X18 ~~LOC~~+RFID (SPONGE) IMPLANT
SUT PROLENE 6 0 BV (SUTURE) IMPLANT
SUT VIC AB 1 CT1 18XBRD ANBCTR (SUTURE) ×2 IMPLANT
SUT VIC AB 1 CT1 8-18 (SUTURE) ×1
SUT VIC AB 2-0 CP2 18 (SUTURE) ×2 IMPLANT
SUT VIC AB 3-0 SH 8-18 (SUTURE) ×2 IMPLANT
SUT VIC AB 4-0 RB1 18 (SUTURE) ×2 IMPLANT
SYR 3ML LL SCALE MARK (SYRINGE) ×8 IMPLANT
TOWEL GREEN STERILE (TOWEL DISPOSABLE) ×2 IMPLANT
TOWEL GREEN STERILE FF (TOWEL DISPOSABLE) ×2 IMPLANT
TRAY FOLEY MTR SLVR 16FR STAT (SET/KITS/TRAYS/PACK) ×2 IMPLANT
WATER STERILE IRR 1000ML POUR (IV SOLUTION) ×2 IMPLANT

## 2022-03-16 NOTE — Anesthesia Postprocedure Evaluation (Signed)
Anesthesia Post Note  Patient: Joseph Fletcher  Procedure(s) Performed: L3-4 PLIF w/L2-3 Laminotomies (Back)     Patient location during evaluation: PACU Anesthesia Type: General Level of consciousness: awake and alert Pain management: pain level controlled Vital Signs Assessment: post-procedure vital signs reviewed and stable Respiratory status: spontaneous breathing, nonlabored ventilation, respiratory function stable and patient connected to nasal cannula oxygen Cardiovascular status: blood pressure returned to baseline and stable Postop Assessment: no apparent nausea or vomiting Anesthetic complications: no  No notable events documented.  Last Vitals:  Vitals:   03/16/22 1335 03/16/22 1348  BP: 123/75 (!) 101/55  Pulse: 66 (!) 59  Resp: 13 16  Temp: 37.1 C 36.5 C  SpO2: 97% 95%    Last Pain:  Vitals:   03/16/22 1348  TempSrc: Oral  PainSc:                  [name]

## 2022-03-16 NOTE — Op Note (Signed)
Date of surgery: 03/16/2022 Preoperative diagnosis: Spondylolisthesis L3-L4 spinal stenosis L2-L3 neurogenic claudication, lumbar radiculopathy. Postoperative diagnosis: Same Procedure: Bilateral laminotomies and foraminotomies and decompression L2-3 and L3-4.  More work than required for simple interbody technique.  Posterior lumbar interbody arthrodesis L3-L4 with peek spacers local autograft allograft and Prody os Pedicle screw fixation L3-L4 with posterolateral arthrodesis using local autograft allograft and Proteios. Surgeon: Kristeen Miss First Assistant: Elwin Sleight, DO Anesthesia: General endotracheal Indications: Kern Gingras is a 72 year old individual has had progressive back and bilateral lower extremity pain and weakness he has been experiencing this problem for several years time and has failed efforts at conservative management.  He was advised regarding the need for surgical decompression and stabilization at L3-4 and decompression at the level of L2-L3.  He is now admitted for this procedure.  Procedure: Patient was brought to the operating room supine on the stretcher.  After the smooth induction of general endotracheal anesthesia, he was carefully turned prone.  The back was prepped with alcohol DuraPrep and draped in a sterile fashion.  A midline incision was created and carried down to the lumbodorsal fascia.  First identifiable interspace was noted to be that of L3-4 on fluoroscopic images.  By dissecting out over markedly hypertrophied facets at the L3-L4 site the dissection was continued by creating laminotomies removing the inferior margin lamina of L3 out to including the entirety of the facet at L3-4.  This bone was saved for for use of bone graft.  This was done bilaterally.  Then the thickened redundant yellow ligament in this area was taken up and the underlying common dural tube was identified.  The path of the L3 nerve root superiorly was decompressed carefully and the nerve  root was isolated the path of the L4 nerve root inferiorly was carefully decompressed also the disc base was isolated once this was completed bilaterally the disc base was entered.  #15 blade was used to do this in a series of shavers were then used to shave the severely degenerated desiccated disc material from the region of L3-L4.  A complete discectomy was performed bilaterally.  Then with the help of Dr. Reatha Armour providing retraction I decorticated the endplates completely.  We then chose a spacer to fit in the centers of all and was felt that a 10 x 9 x 23 mm spacer with 12 degrees lordosis would fit best.  15 cc of autograft allograft and Prody os was packed into the interspace along with the 2 spacers.  Once this was accomplished care was taken to make sure that the L3 and L4 nerve roots were remained well decompressed then attention was turned to L2-3 where we performed laminotomies.  The inferior margin lamina of L2 was taken down out to the medial wall of the facet and a partial medial facetectomy was completed.  Thick and redundant yellow ligament was taken up in the common dural tube and lateral recesses were decompressed superiorly to decompress the L2 nerve root and inferiorly to decompress the L3 nerve root once this was accomplished pedicle entry sites were then chosen at L3 and L4.  Fluoroscopic guidance was used to place a 6.5 x 45 mm screws in L3 and L4.  Prior to this the lateral gutters which had been decorticated out to the transverse processes of L3 and L4 were packed with an additional 9 cc of bone graft on each lateral gutter.  Once the graft was packed pedicle screws were placed and 40 mm precontoured rods  were connected and a neutral construct.  The area was then inspected visually to make sure there is no significant bleeding or foreign bodies and once verified final radiographs were obtained in AP and lateral projection.  Then prior to closing the fascia 25 cc of half percent Marcaine was  injected into the paraspinous tissues and the lumbodorsal fascia was then closed with #1 Vicryl interrupted fashion 2-0 Vicryl was used in the subcutaneous tissues 3-0 Vicryl subcuticularly along with a final 4-0 Vicryl subcuticular closure Dermabond was placed on the skin blood loss was estimated 600 cc and 300 cc of Cell Saver blood was returned to the patient.

## 2022-03-16 NOTE — Progress Notes (Signed)
Orthopedic Tech Progress Note Patient Details:  Joseph Fletcher [birth year] [id number]  LSO adjusted for pt and placed at bedside. Pt and wife at bedside are aware of how to apply/adjust/remove.  Ortho Devices Type of Ortho Device: Lumbar corsett Ortho Device/Splint Location: adjusted then placed at bedside Ortho Device/Splint Interventions: Ordered, Application, Adjustment   Post Interventions Patient Tolerated: Well Instructions Provided: Care of device, Adjustment of device  Markevion Lattin Jeri Modena 03/16/2022, 4:31 PM

## 2022-03-16 NOTE — Anesthesia Preprocedure Evaluation (Signed)
Anesthesia Evaluation  Patient identified by MRN, date of birth, ID band Patient awake    Reviewed: Allergy & Precautions, H&P , NPO status , Patient's Chart, lab work & pertinent test results  Airway Mallampati: II  TM Distance: >3 FB Neck ROM: Full    Dental no notable dental hx.    Pulmonary neg pulmonary ROS   Pulmonary exam normal breath sounds clear to auscultation       Cardiovascular negative cardio ROS Normal cardiovascular exam Rhythm:Regular Rate:Normal     Neuro/Psych negative neurological ROS  negative psych ROS   GI/Hepatic negative GI ROS, Neg liver ROS,,,  Endo/Other  negative endocrine ROS    Renal/GU negative Renal ROS  negative genitourinary   Musculoskeletal negative musculoskeletal ROS (+)    Abdominal   Peds negative pediatric ROS (+)  Hematology negative hematology ROS (+)   Anesthesia Other Findings   Reproductive/Obstetrics negative OB ROS                             Anesthesia Physical Anesthesia Plan  ASA: 2  Anesthesia Plan: General   Post-op Pain Management: Ofirmev IV (intra-op)*   Induction: Intravenous  PONV Risk Score and Plan: 2 and Ondansetron, Dexamethasone and Treatment may vary due to age or medical condition  Airway Management Planned: Oral ETT  Additional Equipment:   Intra-op Plan:   Post-operative Plan: Extubation in OR  Informed Consent: I have reviewed the patients History and Physical, chart, labs and discussed the procedure including the risks, benefits and alternatives for the proposed anesthesia with the patient or authorized representative who has indicated his/her understanding and acceptance.     Dental advisory given  Plan Discussed with: CRNA and Surgeon  Anesthesia Plan Comments:        Anesthesia Quick Evaluation  

## 2022-03-16 NOTE — Transfer of Care (Signed)
Immediate Anesthesia Transfer of Care Note  Patient: Joseph Fletcher  Procedure(s) Performed: L3-4 PLIF w/L2-3 Laminotomies (Back)  Patient Location: PACU  Anesthesia Type:General  Level of Consciousness: drowsy and patient cooperative  Airway & Oxygen Therapy: Patient Spontanous Breathing and Patient connected to nasal cannula oxygen  Post-op Assessment: Report given to RN, Post -op Vital signs reviewed and stable, and Patient moving all extremities  Post vital signs: Reviewed and stable  Last Vitals:  Vitals Value Taken Time  BP 105/71 03/16/22 1237  Temp    Pulse 72 03/16/22 1240  Resp 17 03/16/22 1240  SpO2 98 % 03/16/22 1240  Vitals shown include unvalidated device data.  Last Pain:  Vitals:   03/16/22 0641  TempSrc:   PainSc: 2       Patients Stated Pain Goal: 0 (51/88/41 6606)  Complications: No notable events documented.

## 2022-03-16 NOTE — H&P (Addendum)
Joseph Fletcher is an 72 y.o. male.   Chief Complaint: Back and bilateral leg pain, spondylolisthesis L3-L4, stenosis L2-L3 HPI: Patient is a 72 year old individual whose had progressive difficulty with back pain and leg pain.  He has a spondylolisthesis that was initially evaluated in 2020.  He was advised regarding the need for such surgery but was treated conservatively with a course of physical therapy exercises intermittent epidural steroid injections all of which have become increasingly refractory.  He is now admitted to undergo surgical decompression arthrodesis.  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 He has never used smokeless tobacco. He reports current alcohol use of about 1.0 standard drink of alcohol per week. He reports that he does not use drugs.  Allergies: No Known Allergies  Facility-Administered Medications Prior to Admission  Medication Dose Route Frequency Provider Last Rate Last Admin   0.9 %  sodium chloride infusion  500 mL Intravenous Continuous Milus Banister, MD       Medications Prior to Admission  Medication Sig Dispense Refill   Cholecalciferol (VITAMIN D) 50 MCG (2000 UT) tablet Take 2,000 Units by mouth daily.     finasteride (PROSCAR) 5 MG tablet Take 5 mg by mouth daily.     gabapentin (NEURONTIN) 300 MG capsule  Take 1 capsule (300 mg total) by mouth 3 (three) times daily. (Patient taking differently: Take 300 mg by mouth 2 (two) times daily.) 90 capsule 6   ibuprofen (ADVIL) 200 MG tablet Take 400 mg by mouth every 8 (eight) hours as needed for moderate pain.     Omega-3 Fatty Acids (FISH OIL) 1000 MG CAPS Take 1,000 mg by mouth daily.     traMADol (ULTRAM) 50 MG tablet Take 50 mg by mouth 2 (two) times daily.     VIAGRA 100 MG tablet Take 50 mg by mouth daily as needed for erectile dysfunction.      Results for orders placed or performed during the hospital encounter of 03/16/22 (from the past 48 hour(s))  Type and screen     Status: None (Preliminary result)   Collection Time: 03/16/22  6:16 AM  Result Value Ref Range   ABO/RH(D) PENDING    Antibody Screen PENDING    Sample Expiration      03/19/2022,2359 Performed at [facility], [place] [address] [place], [place] [address]    No results found.  Review of Systems  Musculoskeletal:  Positive for back pain, gait problem and myalgias.  Neurological:  Positive for weakness and numbness.  All other systems reviewed and are negative.   Blood pressure 127/72, pulse 61, temperature 98.2 F (36.8 C), temperature source Oral, resp. rate 17, height '5\' 9"'$  (1.753 m), weight 74.8 kg,  SpO2 98 %. Physical Exam Constitutional:      Appearance: Normal appearance. He is normal weight.  HENT:     Head: Normocephalic and atraumatic.     Right Ear: Tympanic membrane, ear canal and external ear normal.     Left Ear: Tympanic membrane, ear canal and external ear normal.     Nose: Nose normal.     Mouth/Throat:     Mouth: Mucous membranes are dry.     Pharynx: Oropharynx is clear.  Eyes:     Extraocular Movements: Extraocular movements intact.     Conjunctiva/sclera: Conjunctivae normal.     Pupils: Pupils are equal, round, and reactive to light.  Cardiovascular:     Rate and Rhythm: Normal rate and regular rhythm.     Pulses: Normal pulses.      Heart sounds: Normal heart sounds.  Pulmonary:     Effort: Pulmonary effort is normal.     Breath sounds: Normal breath sounds.  Abdominal:     General: Abdomen is flat. Bowel sounds are normal.     Palpations: Abdomen is soft.  Musculoskeletal:     Cervical back: Normal range of motion and neck supple.     Comments: Positive straight leg raising at 30 degrees in either lower extremity Patrick's maneuver is negative bilaterally.  Iliopsoas and quad weakness at 4+ out of 5 absent patellar reflexes.  Skin:    General: Skin is warm and dry.     Capillary Refill: Capillary refill takes less than 2 seconds.  Neurological:     General: No focal deficit present.     Mental Status: He is alert.  Psychiatric:        Mood and Affect: Mood normal.        Behavior: Behavior normal.        Thought Content: Thought content normal.        Judgment: Judgment normal.      Assessment/Plan Spondylolisthesis L3-L4, lumbar spinal stenosis L2-L3, neurogenic claudication, lumbar radiculopathy.  Plan decompression L2-L3 via bilateral laminotomies decompression and arthrodesis L3-L4 with posterior interbody arthrodesis and pedicle screw fixation.  Earleen Newport, MD 03/16/2022, 7:33 AM

## 2022-03-16 NOTE — Anesthesia Procedure Notes (Signed)
Procedure Name: Intubation Date/Time: 03/16/2022 7:52 AM  Performed by: Moshe Salisbury, CRNAPre-anesthesia Checklist: Patient identified, Emergency Drugs available, Suction available and Patient being monitored Patient Re-evaluated:Patient Re-evaluated prior to induction Oxygen Delivery Method: Circle System Utilized Preoxygenation: Pre-oxygenation with 100% oxygen Induction Type: IV induction Ventilation: Mask ventilation without difficulty Laryngoscope Size: Mac and 4 Grade View: Grade III Tube type: Oral Tube size: 8.0 mm Number of attempts: 1 Airway Equipment and Method: Stylet Placement Confirmation: ETT inserted through vocal cords under direct vision, positive ETCO2 and breath sounds checked- equal and bilateral Secured at: 23 cm Tube secured with: Tape Dental Injury: Teeth and Oropharynx as per pre-operative assessment

## 2022-03-17 DIAGNOSIS — M5416 Radiculopathy, lumbar region: Secondary | ICD-10-CM | POA: Diagnosis not present

## 2022-03-17 DIAGNOSIS — Z96642 Presence of left artificial hip joint: Secondary | ICD-10-CM | POA: Diagnosis not present

## 2022-03-17 DIAGNOSIS — Z79899 Other long term (current) drug therapy: Secondary | ICD-10-CM | POA: Diagnosis not present

## 2022-03-17 DIAGNOSIS — M48062 Spinal stenosis, lumbar region with neurogenic claudication: Secondary | ICD-10-CM | POA: Diagnosis not present

## 2022-03-17 DIAGNOSIS — Z85828 Personal history of other malignant neoplasm of skin: Secondary | ICD-10-CM | POA: Diagnosis not present

## 2022-03-17 DIAGNOSIS — M4316 Spondylolisthesis, lumbar region: Secondary | ICD-10-CM | POA: Diagnosis not present

## 2022-03-17 LAB — CBC
HCT: 32.2 % — ABNORMAL LOW (ref 39.0–52.0)
Hemoglobin: 11.4 g/dL — ABNORMAL LOW (ref 13.0–17.0)
MCH: 32.4 pg (ref 26.0–34.0)
MCHC: 35.4 g/dL (ref 30.0–36.0)
MCV: 91.5 fL (ref 80.0–100.0)
Platelets: 127 10*3/uL — ABNORMAL LOW (ref 150–400)
RBC: 3.52 MIL/uL — ABNORMAL LOW (ref 4.22–5.81)
RDW: 11.5 % (ref 11.5–15.5)
WBC: 8.9 10*3/uL (ref 4.0–10.5)
nRBC: 0 % (ref 0.0–0.2)

## 2022-03-17 LAB — BASIC METABOLIC PANEL
Anion gap: 9 (ref 5–15)
BUN: 17 mg/dL (ref 8–23)
CO2: 25 mmol/L (ref 22–32)
Calcium: 8.2 mg/dL — ABNORMAL LOW (ref 8.9–10.3)
Chloride: 102 mmol/L (ref 98–111)
Creatinine, Ser: 0.98 mg/dL (ref 0.61–1.24)
GFR, Estimated: >60 mL/min (ref >60)
Glucose, Bld: 133 mg/dL — ABNORMAL HIGH (ref 70–99)
Potassium: 3.5 mmol/L (ref 3.5–5.1)
Sodium: 136 mmol/L (ref 135–145)

## 2022-03-17 LAB — GLUCOSE, CAPILLARY: Glucose-Capillary: 121 mg/dL — ABNORMAL HIGH (ref 70–99)

## 2022-03-17 MED ORDER — DEXAMETHASONE 1 MG PO TABS: ORAL_TABLET | ORAL | 0 refills | Status: DC

## 2022-03-17 MED ORDER — OXYCODONE-ACETAMINOPHEN 5-325 MG PO TABS: 1.0000 | ORAL_TABLET | ORAL | 0 refills | Status: DC | PRN

## 2022-03-17 MED ORDER — METHOCARBAMOL 500 MG PO TABS: 500.0000 mg | ORAL_TABLET | Freq: Four times a day (QID) | ORAL | 3 refills | Status: DC | PRN

## 2022-03-17 MED ORDER — DEXAMETHASONE 4 MG PO TABS
4.0000 mg | ORAL_TABLET | Freq: Once | ORAL | Status: AC
  Administered 2022-03-17: 4 mg via ORAL
  Filled 2022-03-17: qty 1

## 2022-03-17 MED FILL — Thrombin For Soln 5000 Unit: CUTANEOUS | Qty: 5000 | Status: AC

## 2022-03-17 NOTE — Plan of Care (Signed)
Pt doing well. Pt and wife given D/C instructions with verbal understanding. Rx's were sent to the pharmacy by MD. Pt's incision is clean and dry with no sign of infection. Pt's IV was removed prior to D/C. Pt D/C'd home via wheelchair per MD order. Pt is stable @ D/C and has no other needs at this time. Morry Veiga, RN  

## 2022-03-17 NOTE — Discharge Summary (Signed)
Physician Discharge Summary  Patient ID: Joseph Fletcher MRN: [id number] DOB/AGE: 1950-12-02 72 y.o.  Admit date: 03/16/2022 Discharge date: 03/17/2022  Admission Diagnoses: Lumbar spondylolisthesis L3-4 lumbar stenosis L2-3 L3-4 with neurogenic claudication lumbar radiculopathy  Discharge Diagnoses: Lumbar spondylolisthesis L3-4 lumbar stenosis L2-3 L3-4 with neurogenic claudication, lumbar radiculopathy. Principal Problem:   Spondylolisthesis at L3-L4 level   Discharged Condition: good  Hospital Course: Patient was admitted to undergo surgery which she tolerated well.  His incision is clean and dry he is discharged home  Consults: None  Significant Diagnostic Studies: None  Treatments: surgery: See op note  Discharge Exam: Blood pressure 106/64, pulse 73, temperature 98.6 F (37 C), temperature source Oral, resp. rate 16, height '5\' 9"'$  (1.753 m), weight 74.8 kg, SpO2 100 %. Incision is clean and dry motor function is intact.  Station and gait are intact.  Disposition: Discharge disposition: 01-Home or Self Care       Discharge Instructions     Call MD for:  redness, tenderness, or signs of infection (pain, swelling, redness, odor or green/yellow discharge around incision site)   Complete by: As directed    Call MD for:  severe uncontrolled pain   Complete by: As directed    Call MD for:  temperature >100.4   Complete by: As directed    Diet - low sodium heart healthy   Complete by: As directed    Incentive spirometry RT   Complete by: As directed    Increase activity slowly   Complete by: As directed       Allergies as of 03/17/2022   No Known Allergies      Medication List     TAKE these medications    dexamethasone 1 MG tablet Commonly known as: DECADRON 2 tablets twice daily for 2 days, one tablet twice daily for 2 days, one tablet daily for 2 days.   finasteride 5 MG tablet Commonly known as: PROSCAR Take 5 mg by mouth daily.   Fish Oil 1000 MG  Caps Take 1,000 mg by mouth daily.   gabapentin 300 MG capsule Commonly known as: NEURONTIN Take 1 capsule (300 mg total) by mouth 3 (three) times daily. What changed: when to take this   ibuprofen 200 MG tablet Commonly known as: ADVIL Take 400 mg by mouth every 8 (eight) hours as needed for moderate pain.   methocarbamol 500 MG tablet Commonly known as: ROBAXIN Take 1 tablet (500 mg total) by mouth every 6 (six) hours as needed for muscle spasms.   oxyCODONE-acetaminophen 5-325 MG tablet Commonly known as: PERCOCET/ROXICET Take 1-2 tablets by mouth every 4 (four) hours as needed for moderate pain or severe pain.   traMADol 50 MG tablet Commonly known as: ULTRAM Take 50 mg by mouth 2 (two) times daily.   Viagra 100 MG tablet Generic drug: sildenafil Take 50 mg by mouth daily as needed for erectile dysfunction.   Vitamin D 50 MCG (2000 UT) tablet Take 2,000 Units by mouth daily.         Signed: Blanchie Dessert Jlee Harkless 03/17/2022, 8:21 AM

## 2022-03-17 NOTE — Evaluation (Signed)
Occupational Therapy Evaluation/Discharge Patient Details Name: Joseph Fletcher MRN: [id number] DOB: [birth year] Today's Date: 03/17/2022   History of Present Illness Pt is a 72 y/o male admitted for laminotomies, foraminotomies and decompression at L2-3 and L3-4 in setting of spinal stenosis and spondylolisthesis. 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 Pt presents now s/p sx above with minor back pain. Educated re: back precautions for ADLs, body mechanics for bed mobility and IADLs, LSO brace mgmt, and continued light activity at home. Pt able to demo majority of ADLs Independently - will have wife assist or use AE prn. Pt able to complete hallway mobility Independently without concerns at end of session. No further skilled OT services needed at acute level or on DC.       Recommendations for follow up therapy are one component of a multi-disciplinary discharge planning process, led by the attending physician.  Recommendations may be updated based on patient status, additional functional criteria and insurance authorization.   Follow Up Recommendations  No OT follow up     Assistance Recommended at Discharge PRN  Patient can return home with the following Assist for transportation    Functional Status Assessment  Patient has not had a recent decline in their functional status  Equipment Recommendations  None recommended by OT    Recommendations for Other Services       Precautions / Restrictions Precautions Precautions: Back Precaution Booklet Issued: Yes (comment) Required Braces or Orthoses: Spinal Brace Spinal Brace: Lumbar corset Restrictions Weight Bearing Restrictions: No      Mobility Bed Mobility Overal bed mobility: Modified Independent                  Transfers Overall transfer level: Independent Equipment used: None                       Balance Overall balance assessment: Independent                                         ADL either performed or assessed with clinical judgement   ADL Overall ADL's : Independent                                       General ADL Comments: educated on back precautions for ADLs with pt able to return demo dressing, LSO brace mgmt without much difficulty. light assist for socks/shoes - pt reports having a shoehorn at home and familiar with other AE as needed. educated on body mechanics to get OOB, access items in kitchen and light exercises to complete     Vision Baseline Vision/History: 1 Wears glasses Ability to See in Adequate Light: 0 Adequate Patient Visual Report: No change from baseline Vision Assessment?: No apparent visual deficits     Perception     Praxis      Pertinent Vitals/Pain Pain Assessment Pain Assessment: Faces Faces Pain Scale: Hurts a little bit Pain Location: low back Pain Descriptors / Indicators: Sore Pain Intervention(s): Monitored during session, Premedicated before session     Hand Dominance Right   Extremity/Trunk Assessment Upper Extremity Assessment Upper Extremity Assessment: Overall WFL for tasks assessed   Lower Extremity Assessment  Lower Extremity Assessment: Defer to PT evaluation   Cervical / Trunk Assessment Cervical / Trunk Assessment: Back Surgery   Communication Communication Communication: No difficulties   Cognition Arousal/Alertness: Awake/alert Behavior During Therapy: WFL for tasks assessed/performed Overall Cognitive Status: Within Functional Limits for tasks assessed                                       General Comments       Exercises     Shoulder Instructions      Home Living Family/patient expects to be discharged to:: Private residence Living Arrangements: Spouse/significant other Available Help at Discharge: Family;Friend(s) Type of  Home: House Home Access: Level entry     Home Layout: One level     Bathroom Shower/Tub: Occupational psychologist: Handicapped height Bathroom Accessibility: Yes How Accessible: Accessible via wheelchair Home Equipment: Shower seat - built in;Hand held shower head;Grab bars - tub/shower          Prior Functioning/Environment Prior Level of Function : Independent/Modified Independent               ADLs Comments: Armed forces operational officer        OT Problem List:        OT Treatment/Interventions:      OT Goals(Current goals can be found in the care plan section) Acute Rehab OT Goals Patient Stated Goal: recover well, get back to tennis OT Goal Formulation: All assessment and education complete, DC therapy  OT Frequency:      Co-evaluation              AM-PAC OT "6 Clicks" Daily Activity     Outcome Measure Help from another person eating meals?: None Help from another person taking care of personal grooming?: None Help from another person toileting, which includes using toliet, bedpan, or urinal?: None Help from another person bathing (including washing, rinsing, drying)?: None Help from another person to put on and taking off regular upper body clothing?: None Help from another person to put on and taking off regular lower body clothing?: None 6 Click Score: 24   End of Session Equipment Utilized During Treatment: Back brace Nurse Communication: Mobility status  Activity Tolerance: Patient tolerated treatment well Patient left: in bed;with call bell/phone within reach  OT Visit Diagnosis: Pain Pain - part of body:  (back)                Time: 9381-8299 OT Time Calculation (min): 23 min Charges:  OT General Charges $OT Visit: 1 Visit OT Evaluation $OT Eval Low Complexity: 1 Low  Malachy Chamber, OTR/L Acute Rehab Services Office: [phone]   Layla Maw 03/17/2022, 8:02 AM

## 2022-03-17 NOTE — Progress Notes (Signed)
Physical Therapy Note  Spoke with occupational therapy after their initial evaluation. OT reports patient is functioning at a high level of independence. Spoke with patient directly and answered all questions. Pt politely declines physical therapy evaluation, has been ambulating on unit independently. PT is signing-off. Please re-order if there is any significant change in status. Thank you for this referral.  Candie Mile, PT, DPT Physical Therapist Acute Rehabilitation Services [facility]

## 2022-03-17 NOTE — Progress Notes (Signed)
Pt with syncopal episode after standing for 2 minutes. Pt had been administered 2 Percocet tablets for pain 1 hour prior to episode, and then a ginger ale for mild nausea. Morning ambulation had also been delayed at that time due to pt's nausea. VS stable and CBG = 121.

## 2022-03-30 ENCOUNTER — Ambulatory Visit (HOSPITAL_BASED_OUTPATIENT_CLINIC_OR_DEPARTMENT_OTHER)
Admission: RE | Admit: 2022-03-30 | Discharge: 2022-03-30 | Disposition: A | Discharge status: Home / Self Care | Payer: Medicare HMO | Source: Ambulatory Visit | Attending: Neurological Surgery | Admitting: Neurological Surgery

## 2022-03-30 ENCOUNTER — Other Ambulatory Visit (HOSPITAL_BASED_OUTPATIENT_CLINIC_OR_DEPARTMENT_OTHER): Payer: Self-pay | Admitting: Neurological Surgery

## 2022-03-30 DIAGNOSIS — M4316 Spondylolisthesis, lumbar region: Secondary | ICD-10-CM | POA: Insufficient documentation

## 2022-03-30 DIAGNOSIS — M79661 Pain in right lower leg: Secondary | ICD-10-CM | POA: Diagnosis not present

## 2022-03-30 DIAGNOSIS — M79662 Pain in left lower leg: Secondary | ICD-10-CM | POA: Diagnosis not present

## 2022-03-31 MED FILL — Sodium Chloride Irrigation Soln 0.9%: Qty: 3000 | Status: AC

## 2022-03-31 MED FILL — Sodium Chloride IV Soln 0.9%: INTRAVENOUS | Qty: 1000 | Status: AC

## 2022-03-31 MED FILL — Heparin Sodium (Porcine) Inj 1000 Unit/ML: INTRAMUSCULAR | Qty: 30 | Status: AC

## 2022-04-03 DIAGNOSIS — M4316 Spondylolisthesis, lumbar region: Secondary | ICD-10-CM | POA: Diagnosis not present

## 2022-04-03 DIAGNOSIS — M48062 Spinal stenosis, lumbar region with neurogenic claudication: Secondary | ICD-10-CM | POA: Diagnosis not present

## 2022-05-03 DIAGNOSIS — M48062 Spinal stenosis, lumbar region with neurogenic claudication: Secondary | ICD-10-CM | POA: Diagnosis not present

## 2022-05-23 ENCOUNTER — Ambulatory Visit
Admission: RE | Admit: 2022-05-23 | Discharge: 2022-05-23 | Disposition: A | Discharge status: Home / Self Care | Payer: No Typology Code available for payment source | Source: Ambulatory Visit | Attending: Internal Medicine | Admitting: Internal Medicine

## 2022-05-23 DIAGNOSIS — Z8249 Family history of ischemic heart disease and other diseases of the circulatory system: Secondary | ICD-10-CM

## 2022-05-29 ENCOUNTER — Encounter: Payer: Self-pay | Admitting: Internal Medicine

## 2022-05-29 ENCOUNTER — Ambulatory Visit: Payer: Medicare HMO | Attending: Internal Medicine | Admitting: Internal Medicine

## 2022-05-29 VITALS — BP 120/80 | HR 65 | Ht 69.0 in | Wt 169.0 lb

## 2022-05-29 DIAGNOSIS — E782 Mixed hyperlipidemia: Secondary | ICD-10-CM

## 2022-05-29 DIAGNOSIS — I2584 Coronary atherosclerosis due to calcified coronary lesion: Secondary | ICD-10-CM | POA: Diagnosis not present

## 2022-05-29 DIAGNOSIS — I700 Atherosclerosis of aorta: Secondary | ICD-10-CM

## 2022-05-29 DIAGNOSIS — I2510 Atherosclerotic heart disease of native coronary artery without angina pectoris: Secondary | ICD-10-CM | POA: Insufficient documentation

## 2022-05-29 NOTE — Patient Instructions (Signed)
Medication Instructions:  Your physician recommends that you continue on your current medications as directed. Please refer to the Current Medication list given to you today.  *If you need a refill on your cardiac medications before your next appointment, please call your pharmacy*   Lab Work: NONE If you have labs (blood work) drawn today and your tests are completely normal, you will receive your results only by: MyChart Message (if you have MyChart) OR A paper copy in the mail If you have any lab test that is abnormal or we need to change your treatment, we will call you to review the results.   Testing/Procedures: NONE   Follow-Up: At [facility], you and your health needs are our priority.  As part of our continuing mission to provide you with exceptional heart care, we have created designated Provider Care Teams.  These Care Teams include your primary Cardiologist (physician) and Advanced Practice Providers (APPs -  Physician Assistants and Nurse Practitioners) who all work together to provide you with the care you need, when you need it.   Your next appointment:   1 year(s)  Provider:   Mahesh Chandrasekhar, MD     

## 2022-05-29 NOTE — Progress Notes (Signed)
Cardiology Office Note:    Date:  05/29/2022   ID:  Joseph Fletcher, DOB 04/13/1950, MRN [id number]  PCP:  Haywood Pao, MD   [facility] Providers Cardiologist:  None     Referring MD: Haywood Pao, MD   CC: CAC Consulted for the evaluation of CAC at the behest of Dr. Osborne Casco  History of Present Illness:    Joseph Fletcher is a 72 y.o. male with a hx of Aortic atherosclerosis, LAD and RCA CAC.   Patient notes that he is feeling great.   Notes that he is doing well. 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   Has had LDL elevations for years but was asymptomatic; BP was ok with no therapy.    Has had no chest pain, chest pressure, chest tightness, chest stinging . Patient exertion notable for walking and exercising with no symptoms.   Plays tennis.  Is nationally ranked.  No shortness of breath, DOE .  No PND or orthopnea.  No weight gain, leg swelling , or abdominal swelling.  No syncope or near syncope . Notes  no palpitations or funny heart beats.      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   Cholecalciferol (VITAMIN D) 50 MCG (2000 UT) tablet Take 2,000 Units by mouth daily.   finasteride (PROSCAR) 5 MG tablet Take 5 mg by mouth daily.   Omega-3 Fatty Acids (FISH OIL) 1000 MG CAPS Take 1,000 mg by mouth daily.   rosuvastatin (CRESTOR) 20 MG tablet daily.   VIAGRA 100 MG tablet Take 50 mg by mouth daily as needed  for erectile dysfunction.   [DISCONTINUED] dexamethasone (DECADRON) 1 MG tablet 2 tablets twice daily for 2 days, one tablet twice daily for 2 days, one tablet daily for 2 days.   [DISCONTINUED] ibuprofen (ADVIL) 200 MG tablet Take 400 mg by mouth every 8 (eight) hours as needed for moderate pain.   [DISCONTINUED] methocarbamol (ROBAXIN) 500 MG tablet Take 1 tablet (500 mg total) by mouth every 6 (six) hours as needed for muscle spasms.   [DISCONTINUED] oxyCODONE-acetaminophen (PERCOCET/ROXICET) 5-325 MG tablet Take 1-2 tablets by mouth every 4 (four) hours as needed for moderate pain or severe pain.   [DISCONTINUED] traMADol (ULTRAM) 50 MG tablet Take 50 mg by mouth 2 (two) times daily.   Current Facility-Administered Medications for the 05/29/22 encounter (Office Visit) with Werner Lean, MD  Medication   0.9 %  sodium chloride infusion     Allergies:   Patient has no known allergies.   Social History   Socioeconomic History   Marital status: Married    Spouse name: Not on file   Number of children: Not on file   Years of education: Not on file   Highest education level: Not on file  Occupational History   Not on file  Tobacco Use   Smoking status: Never  Smokeless tobacco: Never  Vaping Use   Vaping Use: Never used  Substance and Sexual Activity   Alcohol use: Yes    Alcohol/week: 1.0 standard drink of alcohol    Types: 1 Glasses of wine per week    Comment: 1 glass weekly   Drug use: No   Sexual activity: Yes  Other Topics Concern   Not on file  Social History Narrative   Not on file   Social Determinants of Health   Financial Resource Strain: Not on file  Food Insecurity: Not on file  Transportation Needs: Not on file  Physical Activity: Not on file  Stress: Not on file  Social Connections: Not on file    Social: My Jilda Roche, Friends with Rockne Menghini, they have a dog Gracie  Family History: The patient's family history includes Heart disease in his  father; Hyperlipidemia in his mother. There is no history of Cancer, Stroke, or Colon cancer.  ROS:   Please see the history of present illness.     All other systems reviewed and are negative.  EKGs/Labs/Other Studies Reviewed:    The following studies were reviewed today:  EKG:  EKG is  ordered today.  The ekg ordered today demonstrates  05/29/22: Sinus rhythm     Recent Labs: 03/17/2022: BUN 17; Creatinine, Ser 0.98; Hemoglobin 11.4; Platelets 127; Potassium 3.5; Sodium 136  Recent Lipid Panel No results found for: "CHOL", "TRIG", "HDL", "CHOLHDL", "VLDL", "LDLCALC", "LDLDIRECT"      Physical Exam:    VS:  BP 120/80   Pulse 65   Ht 5\' 9"  (1.753 m)   Wt 169 lb (76.7 kg)   SpO2 97%   BMI 24.96 kg/m     Wt Readings from Last 3 Encounters:  05/29/22 169 lb (76.7 kg)  03/16/22 165 lb (74.8 kg)  02/05/18 172 lb (78 kg)    GEN:  Well nourished, well developed in no acute distress HEENT: Bilateral Frank's Sign NECK: No JVD CARDIAC: RRR, no murmurs, rubs, gallops RESPIRATORY:  Clear to auscultation without rales, wheezing or rhonchi  ABDOMEN: Soft, non-tender, non-distended MUSCULOSKELETAL:  No edema; No deformity  SKIN: Warm and dry NEUROLOGIC:  Alert and oriented x 3 PSYCHIATRIC:  Normal affect   ASSESSMENT:    1. Coronary artery calcification   2. Mixed hyperlipidemia   3. 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  Concerns regarding medicines are outlined above.   Orders Placed This Encounter  Procedures   EKG 12-Lead   No orders of the defined types were placed in this encounter.   Patient Instructions  Medication Instructions:  Your physician recommends that you continue on your current medications as directed. Please refer to the Current Medication list given to you today.  *If you need a refill on your cardiac medications before your next appointment, please call your pharmacy*   Lab Work: NONE If you have labs (blood work) drawn today and your tests are completely normal,  you will receive your results only by: [place] (if you have MyChart) OR A paper copy in the mail If you have any lab test that is abnormal or we need to change your treatment, we will call you to review the results.   Testing/Procedures: NONE   Follow-Up: At [facility], you and your health needs are our priority.  As part of our continuing mission to provide you with exceptional heart care, we have created designated Provider Care Teams.  These Care Teams include your primary Cardiologist (physician) and Advanced Practice Providers (APPs -  Physician Assistants and Nurse Practitioners) who all work together to provide you with the care you need, when you need it.     Your next appointment:   1 year(s)  Provider:   Rudean Haskell, MD       Signed, Werner Lean, MD  05/29/2022 5:16 PM    [place]

## 2022-06-16 DIAGNOSIS — M4316 Spondylolisthesis, lumbar region: Secondary | ICD-10-CM | POA: Diagnosis not present

## 2022-06-16 DIAGNOSIS — Z6825 Body mass index (BMI) 25.0-25.9, adult: Secondary | ICD-10-CM | POA: Diagnosis not present

## 2022-06-16 DIAGNOSIS — M48062 Spinal stenosis, lumbar region with neurogenic claudication: Secondary | ICD-10-CM | POA: Diagnosis not present

## 2022-07-06 DIAGNOSIS — M5412 Radiculopathy, cervical region: Secondary | ICD-10-CM | POA: Diagnosis not present

## 2022-07-06 DIAGNOSIS — M545 Low back pain, unspecified: Secondary | ICD-10-CM | POA: Diagnosis not present

## 2022-07-06 DIAGNOSIS — M4316 Spondylolisthesis, lumbar region: Secondary | ICD-10-CM | POA: Diagnosis not present

## 2022-07-26 DIAGNOSIS — M545 Low back pain, unspecified: Secondary | ICD-10-CM | POA: Diagnosis not present

## 2022-07-26 DIAGNOSIS — M5412 Radiculopathy, cervical region: Secondary | ICD-10-CM | POA: Diagnosis not present

## 2022-07-26 DIAGNOSIS — M4316 Spondylolisthesis, lumbar region: Secondary | ICD-10-CM | POA: Diagnosis not present

## 2022-08-02 DIAGNOSIS — D485 Neoplasm of uncertain behavior of skin: Secondary | ICD-10-CM | POA: Diagnosis not present

## 2022-08-02 DIAGNOSIS — D0421 Carcinoma in situ of skin of right ear and external auricular canal: Secondary | ICD-10-CM | POA: Diagnosis not present

## 2022-08-16 DIAGNOSIS — M545 Low back pain, unspecified: Secondary | ICD-10-CM | POA: Diagnosis not present

## 2022-08-16 DIAGNOSIS — M5412 Radiculopathy, cervical region: Secondary | ICD-10-CM | POA: Diagnosis not present

## 2022-08-16 DIAGNOSIS — M4316 Spondylolisthesis, lumbar region: Secondary | ICD-10-CM | POA: Diagnosis not present

## 2022-08-22 DIAGNOSIS — Z1152 Encounter for screening for COVID-19: Secondary | ICD-10-CM | POA: Diagnosis not present

## 2022-08-22 DIAGNOSIS — R0981 Nasal congestion: Secondary | ICD-10-CM | POA: Diagnosis not present

## 2022-08-22 DIAGNOSIS — R509 Fever, unspecified: Secondary | ICD-10-CM | POA: Diagnosis not present

## 2022-08-22 DIAGNOSIS — J028 Acute pharyngitis due to other specified organisms: Secondary | ICD-10-CM | POA: Diagnosis not present

## 2022-08-22 DIAGNOSIS — B9689 Other specified bacterial agents as the cause of diseases classified elsewhere: Secondary | ICD-10-CM | POA: Diagnosis not present

## 2022-08-22 DIAGNOSIS — R52 Pain, unspecified: Secondary | ICD-10-CM | POA: Diagnosis not present

## 2022-08-22 DIAGNOSIS — R062 Wheezing: Secondary | ICD-10-CM | POA: Diagnosis not present

## 2022-08-22 DIAGNOSIS — J029 Acute pharyngitis, unspecified: Secondary | ICD-10-CM | POA: Diagnosis not present

## 2022-09-04 DIAGNOSIS — M545 Low back pain, unspecified: Secondary | ICD-10-CM | POA: Diagnosis not present

## 2022-09-04 DIAGNOSIS — M4316 Spondylolisthesis, lumbar region: Secondary | ICD-10-CM | POA: Diagnosis not present

## 2022-09-04 DIAGNOSIS — M5412 Radiculopathy, cervical region: Secondary | ICD-10-CM | POA: Diagnosis not present

## 2022-10-17 DIAGNOSIS — N401 Enlarged prostate with lower urinary tract symptoms: Secondary | ICD-10-CM | POA: Diagnosis not present

## 2022-10-17 DIAGNOSIS — Z1212 Encounter for screening for malignant neoplasm of rectum: Secondary | ICD-10-CM | POA: Diagnosis not present

## 2022-10-17 DIAGNOSIS — E7800 Pure hypercholesterolemia, unspecified: Secondary | ICD-10-CM | POA: Diagnosis not present

## 2022-10-20 DIAGNOSIS — G629 Polyneuropathy, unspecified: Secondary | ICD-10-CM | POA: Diagnosis not present

## 2022-10-20 DIAGNOSIS — M4800 Spinal stenosis, site unspecified: Secondary | ICD-10-CM | POA: Diagnosis not present

## 2022-10-20 DIAGNOSIS — M1990 Unspecified osteoarthritis, unspecified site: Secondary | ICD-10-CM | POA: Diagnosis not present

## 2022-10-20 DIAGNOSIS — Z8249 Family history of ischemic heart disease and other diseases of the circulatory system: Secondary | ICD-10-CM | POA: Diagnosis not present

## 2022-10-20 DIAGNOSIS — N529 Male erectile dysfunction, unspecified: Secondary | ICD-10-CM | POA: Diagnosis not present

## 2022-10-20 DIAGNOSIS — Z85828 Personal history of other malignant neoplasm of skin: Secondary | ICD-10-CM | POA: Diagnosis not present

## 2022-10-20 DIAGNOSIS — Z7982 Long term (current) use of aspirin: Secondary | ICD-10-CM | POA: Diagnosis not present

## 2022-10-20 DIAGNOSIS — E785 Hyperlipidemia, unspecified: Secondary | ICD-10-CM | POA: Diagnosis not present

## 2022-10-20 DIAGNOSIS — Z96643 Presence of artificial hip joint, bilateral: Secondary | ICD-10-CM | POA: Diagnosis not present

## 2022-10-20 DIAGNOSIS — N400 Enlarged prostate without lower urinary tract symptoms: Secondary | ICD-10-CM | POA: Diagnosis not present

## 2022-10-20 DIAGNOSIS — I2510 Atherosclerotic heart disease of native coronary artery without angina pectoris: Secondary | ICD-10-CM | POA: Diagnosis not present

## 2022-10-25 DIAGNOSIS — Z1331 Encounter for screening for depression: Secondary | ICD-10-CM | POA: Diagnosis not present

## 2022-10-25 DIAGNOSIS — Z8249 Family history of ischemic heart disease and other diseases of the circulatory system: Secondary | ICD-10-CM | POA: Diagnosis not present

## 2022-10-25 DIAGNOSIS — R82998 Other abnormal findings in urine: Secondary | ICD-10-CM | POA: Diagnosis not present

## 2022-10-25 DIAGNOSIS — Z0000 Encounter for general adult medical examination without abnormal findings: Secondary | ICD-10-CM | POA: Diagnosis not present

## 2022-10-25 DIAGNOSIS — Z96649 Presence of unspecified artificial hip joint: Secondary | ICD-10-CM | POA: Diagnosis not present

## 2022-10-25 DIAGNOSIS — N401 Enlarged prostate with lower urinary tract symptoms: Secondary | ICD-10-CM | POA: Diagnosis not present

## 2022-10-25 DIAGNOSIS — I2584 Coronary atherosclerosis due to calcified coronary lesion: Secondary | ICD-10-CM | POA: Diagnosis not present

## 2022-10-25 DIAGNOSIS — M48062 Spinal stenosis, lumbar region with neurogenic claudication: Secondary | ICD-10-CM | POA: Diagnosis not present

## 2022-10-25 DIAGNOSIS — I2510 Atherosclerotic heart disease of native coronary artery without angina pectoris: Secondary | ICD-10-CM | POA: Diagnosis not present

## 2022-10-25 DIAGNOSIS — Z820 Family history of epilepsy and other diseases of the nervous system: Secondary | ICD-10-CM | POA: Diagnosis not present

## 2022-10-25 DIAGNOSIS — E7800 Pure hypercholesterolemia, unspecified: Secondary | ICD-10-CM | POA: Diagnosis not present

## 2022-10-25 DIAGNOSIS — Z1339 Encounter for screening examination for other mental health and behavioral disorders: Secondary | ICD-10-CM | POA: Diagnosis not present

## 2022-10-25 DIAGNOSIS — M169 Osteoarthritis of hip, unspecified: Secondary | ICD-10-CM | POA: Diagnosis not present

## 2022-10-25 DIAGNOSIS — Z1212 Encounter for screening for malignant neoplasm of rectum: Secondary | ICD-10-CM | POA: Diagnosis not present

## 2022-12-04 DIAGNOSIS — H5203 Hypermetropia, bilateral: Secondary | ICD-10-CM | POA: Diagnosis not present

## 2022-12-04 DIAGNOSIS — Z0100 Encounter for examination of eyes and vision without abnormal findings: Secondary | ICD-10-CM | POA: Diagnosis not present

## 2023-02-13 DIAGNOSIS — L819 Disorder of pigmentation, unspecified: Secondary | ICD-10-CM | POA: Diagnosis not present

## 2023-02-13 DIAGNOSIS — D225 Melanocytic nevi of trunk: Secondary | ICD-10-CM | POA: Diagnosis not present

## 2023-02-13 DIAGNOSIS — Z85828 Personal history of other malignant neoplasm of skin: Secondary | ICD-10-CM | POA: Diagnosis not present

## 2023-02-13 DIAGNOSIS — L570 Actinic keratosis: Secondary | ICD-10-CM | POA: Diagnosis not present

## 2023-02-13 DIAGNOSIS — D2272 Melanocytic nevi of left lower limb, including hip: Secondary | ICD-10-CM | POA: Diagnosis not present

## 2023-02-13 DIAGNOSIS — L821 Other seborrheic keratosis: Secondary | ICD-10-CM | POA: Diagnosis not present

## 2023-02-13 DIAGNOSIS — L905 Scar conditions and fibrosis of skin: Secondary | ICD-10-CM | POA: Diagnosis not present

## 2023-02-13 DIAGNOSIS — D2271 Melanocytic nevi of right lower limb, including hip: Secondary | ICD-10-CM | POA: Diagnosis not present

## 2023-03-07 DIAGNOSIS — M5412 Radiculopathy, cervical region: Secondary | ICD-10-CM | POA: Diagnosis not present

## 2023-03-07 DIAGNOSIS — Z6825 Body mass index (BMI) 25.0-25.9, adult: Secondary | ICD-10-CM | POA: Diagnosis not present

## 2023-03-28 DIAGNOSIS — M4316 Spondylolisthesis, lumbar region: Secondary | ICD-10-CM | POA: Diagnosis not present

## 2023-03-28 DIAGNOSIS — M5412 Radiculopathy, cervical region: Secondary | ICD-10-CM | POA: Diagnosis not present

## 2023-03-28 DIAGNOSIS — M47812 Spondylosis without myelopathy or radiculopathy, cervical region: Secondary | ICD-10-CM | POA: Diagnosis not present

## 2023-03-28 DIAGNOSIS — M50221 Other cervical disc displacement at C4-C5 level: Secondary | ICD-10-CM | POA: Diagnosis not present

## 2023-03-28 DIAGNOSIS — M5126 Other intervertebral disc displacement, lumbar region: Secondary | ICD-10-CM | POA: Diagnosis not present

## 2023-03-28 DIAGNOSIS — M50322 Other cervical disc degeneration at C5-C6 level: Secondary | ICD-10-CM | POA: Diagnosis not present

## 2023-03-28 DIAGNOSIS — M5127 Other intervertebral disc displacement, lumbosacral region: Secondary | ICD-10-CM | POA: Diagnosis not present

## 2023-03-28 DIAGNOSIS — M5021 Other cervical disc displacement,  high cervical region: Secondary | ICD-10-CM | POA: Diagnosis not present

## 2023-03-28 DIAGNOSIS — M5136 Other intervertebral disc degeneration, lumbar region with discogenic back pain only: Secondary | ICD-10-CM | POA: Diagnosis not present

## 2023-03-28 DIAGNOSIS — M48061 Spinal stenosis, lumbar region without neurogenic claudication: Secondary | ICD-10-CM | POA: Diagnosis not present

## 2023-03-30 DIAGNOSIS — Z6825 Body mass index (BMI) 25.0-25.9, adult: Secondary | ICD-10-CM | POA: Diagnosis not present

## 2023-03-30 DIAGNOSIS — M5126 Other intervertebral disc displacement, lumbar region: Secondary | ICD-10-CM | POA: Diagnosis not present

## 2023-04-13 DIAGNOSIS — M5117 Intervertebral disc disorders with radiculopathy, lumbosacral region: Secondary | ICD-10-CM | POA: Diagnosis not present

## 2023-04-13 DIAGNOSIS — M5126 Other intervertebral disc displacement, lumbar region: Secondary | ICD-10-CM | POA: Diagnosis not present

## 2023-04-18 DIAGNOSIS — B349 Viral infection, unspecified: Secondary | ICD-10-CM | POA: Diagnosis not present

## 2023-04-18 DIAGNOSIS — R52 Pain, unspecified: Secondary | ICD-10-CM | POA: Diagnosis not present

## 2023-04-18 DIAGNOSIS — R509 Fever, unspecified: Secondary | ICD-10-CM | POA: Diagnosis not present

## 2023-04-18 DIAGNOSIS — J3489 Other specified disorders of nose and nasal sinuses: Secondary | ICD-10-CM | POA: Diagnosis not present

## 2023-04-18 DIAGNOSIS — Z1152 Encounter for screening for COVID-19: Secondary | ICD-10-CM | POA: Diagnosis not present

## 2023-04-18 DIAGNOSIS — R5383 Other fatigue: Secondary | ICD-10-CM | POA: Diagnosis not present

## 2023-04-18 DIAGNOSIS — J029 Acute pharyngitis, unspecified: Secondary | ICD-10-CM | POA: Diagnosis not present

## 2023-05-02 DIAGNOSIS — M25512 Pain in left shoulder: Secondary | ICD-10-CM | POA: Diagnosis not present

## 2023-05-02 DIAGNOSIS — M5412 Radiculopathy, cervical region: Secondary | ICD-10-CM | POA: Diagnosis not present

## 2023-05-02 DIAGNOSIS — G8929 Other chronic pain: Secondary | ICD-10-CM | POA: Diagnosis not present

## 2023-05-07 DIAGNOSIS — M4316 Spondylolisthesis, lumbar region: Secondary | ICD-10-CM | POA: Diagnosis not present

## 2023-05-07 DIAGNOSIS — M545 Low back pain, unspecified: Secondary | ICD-10-CM | POA: Diagnosis not present

## 2023-05-07 DIAGNOSIS — M25512 Pain in left shoulder: Secondary | ICD-10-CM | POA: Diagnosis not present

## 2023-05-07 DIAGNOSIS — M5412 Radiculopathy, cervical region: Secondary | ICD-10-CM | POA: Diagnosis not present

## 2023-05-10 DIAGNOSIS — M25512 Pain in left shoulder: Secondary | ICD-10-CM | POA: Diagnosis not present

## 2023-05-10 DIAGNOSIS — M4316 Spondylolisthesis, lumbar region: Secondary | ICD-10-CM | POA: Diagnosis not present

## 2023-05-10 DIAGNOSIS — M5412 Radiculopathy, cervical region: Secondary | ICD-10-CM | POA: Diagnosis not present

## 2023-05-10 DIAGNOSIS — M545 Low back pain, unspecified: Secondary | ICD-10-CM | POA: Diagnosis not present

## 2023-05-16 DIAGNOSIS — M4316 Spondylolisthesis, lumbar region: Secondary | ICD-10-CM | POA: Diagnosis not present

## 2023-05-16 DIAGNOSIS — M25512 Pain in left shoulder: Secondary | ICD-10-CM | POA: Diagnosis not present

## 2023-05-16 DIAGNOSIS — M545 Low back pain, unspecified: Secondary | ICD-10-CM | POA: Diagnosis not present

## 2023-05-16 DIAGNOSIS — M5412 Radiculopathy, cervical region: Secondary | ICD-10-CM | POA: Diagnosis not present

## 2023-05-23 DIAGNOSIS — M4316 Spondylolisthesis, lumbar region: Secondary | ICD-10-CM | POA: Diagnosis not present

## 2023-05-23 DIAGNOSIS — M5412 Radiculopathy, cervical region: Secondary | ICD-10-CM | POA: Diagnosis not present

## 2023-05-23 DIAGNOSIS — M25512 Pain in left shoulder: Secondary | ICD-10-CM | POA: Diagnosis not present

## 2023-05-23 DIAGNOSIS — M545 Low back pain, unspecified: Secondary | ICD-10-CM | POA: Diagnosis not present

## 2023-05-28 ENCOUNTER — Encounter: Payer: Self-pay | Admitting: Gastroenterology

## 2023-05-30 DIAGNOSIS — M7582 Other shoulder lesions, left shoulder: Secondary | ICD-10-CM | POA: Diagnosis not present

## 2023-05-30 DIAGNOSIS — M19012 Primary osteoarthritis, left shoulder: Secondary | ICD-10-CM | POA: Diagnosis not present

## 2023-05-30 DIAGNOSIS — S46012A Strain of muscle(s) and tendon(s) of the rotator cuff of left shoulder, initial encounter: Secondary | ICD-10-CM | POA: Diagnosis not present

## 2023-05-30 DIAGNOSIS — M25512 Pain in left shoulder: Secondary | ICD-10-CM | POA: Diagnosis not present

## 2023-05-31 DIAGNOSIS — M25512 Pain in left shoulder: Secondary | ICD-10-CM | POA: Diagnosis not present

## 2023-05-31 DIAGNOSIS — M4316 Spondylolisthesis, lumbar region: Secondary | ICD-10-CM | POA: Diagnosis not present

## 2023-05-31 DIAGNOSIS — M5412 Radiculopathy, cervical region: Secondary | ICD-10-CM | POA: Diagnosis not present

## 2023-05-31 DIAGNOSIS — M545 Low back pain, unspecified: Secondary | ICD-10-CM | POA: Diagnosis not present

## 2023-06-11 DIAGNOSIS — M5412 Radiculopathy, cervical region: Secondary | ICD-10-CM | POA: Diagnosis not present

## 2023-06-11 DIAGNOSIS — M545 Low back pain, unspecified: Secondary | ICD-10-CM | POA: Diagnosis not present

## 2023-06-11 DIAGNOSIS — M4316 Spondylolisthesis, lumbar region: Secondary | ICD-10-CM | POA: Diagnosis not present

## 2023-06-11 DIAGNOSIS — M25512 Pain in left shoulder: Secondary | ICD-10-CM | POA: Diagnosis not present

## 2023-06-20 ENCOUNTER — Ambulatory Visit (AMBULATORY_SURGERY_CENTER)

## 2023-06-20 VITALS — Ht 69.0 in | Wt 166.0 lb

## 2023-06-20 DIAGNOSIS — Z860100 Personal history of colon polyps, unspecified: Secondary | ICD-10-CM

## 2023-06-20 MED ORDER — NA SULFATE-K SULFATE-MG SULF 17.5-3.13-1.6 GM/177ML PO SOLN: 1.0000 | Freq: Once | ORAL | 0 refills | Status: AC

## 2023-06-20 NOTE — Progress Notes (Signed)
Pre visit completed via phone call; Patient verified name, DOB, and address; No egg or soy allergy known to patient;  No issues known to pt with past sedation with any surgeries or procedures; Patient denies ever being told they had issues or difficulty with intubation;  No FH of Malignant Hyperthermia; Pt is not on diet pills; Pt is not on home 02;  Pt is not on blood thinners;  Pt denies issues with constipation  No A fib or A flutter; Have any cardiac testing pending--NO Insurance verified during PV appt--- [employer] Medicare Pt can ambulate without assistance;  Pt denies use of chewing tobacco; Discussed diabetic/weight loss medication holds; Discussed NSAID holds; Checked BMI to be less than 50; Pt instructed to use Single[employer]com or GoodRx for a price reduction on prep;  Patient's chart reviewed by Cathlyn Parsons CNRA prior to previsit and patient appropriate for the [facility]; Pre visit completed and red dot placed by patient's name on their procedure day (on provider's schedule);    Instructions sent to MyChart per patient request;

## 2023-06-22 DIAGNOSIS — M25512 Pain in left shoulder: Secondary | ICD-10-CM | POA: Diagnosis not present

## 2023-06-25 ENCOUNTER — Encounter: Payer: Self-pay | Admitting: Gastroenterology

## 2023-07-04 ENCOUNTER — Ambulatory Visit: Admitting: Gastroenterology

## 2023-07-04 ENCOUNTER — Encounter: Payer: Self-pay | Admitting: Gastroenterology

## 2023-07-04 VITALS — BP 141/72 | HR 61 | Temp 97.8°F | Resp 12 | Ht 69.0 in | Wt 166.0 lb

## 2023-07-04 DIAGNOSIS — K635 Polyp of colon: Secondary | ICD-10-CM

## 2023-07-04 DIAGNOSIS — D128 Benign neoplasm of rectum: Secondary | ICD-10-CM

## 2023-07-04 DIAGNOSIS — D122 Benign neoplasm of ascending colon: Secondary | ICD-10-CM | POA: Diagnosis not present

## 2023-07-04 DIAGNOSIS — K641 Second degree hemorrhoids: Secondary | ICD-10-CM

## 2023-07-04 DIAGNOSIS — D127 Benign neoplasm of rectosigmoid junction: Secondary | ICD-10-CM | POA: Diagnosis not present

## 2023-07-04 DIAGNOSIS — Z860101 Personal history of adenomatous and serrated colon polyps: Secondary | ICD-10-CM | POA: Diagnosis not present

## 2023-07-04 DIAGNOSIS — K5730 Diverticulosis of large intestine without perforation or abscess without bleeding: Secondary | ICD-10-CM

## 2023-07-04 DIAGNOSIS — Z1211 Encounter for screening for malignant neoplasm of colon: Secondary | ICD-10-CM | POA: Diagnosis not present

## 2023-07-04 DIAGNOSIS — Z860100 Personal history of colon polyps, unspecified: Secondary | ICD-10-CM

## 2023-07-04 MED ORDER — SODIUM CHLORIDE 0.9 % IV SOLN: 500.0000 mL | Freq: Once | INTRAVENOUS | Status: AC

## 2023-07-04 NOTE — Progress Notes (Signed)
 Report given to PACU, vss

## 2023-07-04 NOTE — Progress Notes (Signed)
 Called to room to assist during endoscopic procedure.  Patient ID and intended procedure confirmed with present staff. Received instructions for my participation in the procedure from the performing physician.

## 2023-07-04 NOTE — Op Note (Signed)
 [facility] Patient Name: Joseph Fletcher Procedure Date: 07/04/2023 10:09 AM MRN: [id number] Endoscopist: Yong Henle , MD, [id number] Age: 73 Referring MD:  Date of Birth: 1950-03-03 Gender: Male Account #: [id number] Procedure:                Colonoscopy Indications:              Surveillance: Personal history of adenomatous                            polyps on last colonoscopy > 5 years ago Medicines:                Monitored Anesthesia Care Procedure:                Pre-Anesthesia Assessment:                           - Prior to the procedure, a History and Physical                            was performed, and patient medications and                            allergies were reviewed. The patient's tolerance of                            previous anesthesia was also reviewed. The risks                            and benefits of the procedure and the sedation                            options and risks were discussed with the patient.                            All questions were answered, and informed consent                            was obtained. Prior Anticoagulants: The patient has                            taken no anticoagulant or antiplatelet agents                            except for aspirin. ASA Grade Assessment: II - A                            patient with mild systemic disease. After reviewing                            the risks and benefits, the patient was deemed in                            satisfactory condition to undergo the procedure.  After obtaining informed consent, the colonoscope                            was passed under direct vision. Throughout the                            procedure, the patient's blood pressure, pulse, and                            oxygen saturations were monitored continuously. The                            Olympus CF-HQ190L ([id number]) Colonoscope was                             introduced through the anus and advanced to the 3                            cm into the ileum. The colonoscopy was performed                            without difficulty. The patient tolerated the                            procedure. The quality of the bowel preparation was                            adequate. The terminal ileum, ileocecal valve,                            appendiceal orifice, and rectum were photographed. Scope In: 10:25:02 AM Scope Out: 10:42:24 AM Scope Withdrawal Time: 0 hours 12 minutes 48 seconds  Total Procedure Duration: 0 hours 17 minutes 22 seconds  Findings:                 The digital rectal exam findings include                            hemorrhoids. Pertinent negatives include no                            palpable rectal lesions.                           The terminal ileum and ileocecal valve appeared                            normal.                           Three sessile polyps were found in the rectum,                            recto-sigmoid colon and ascending colon. The polyps  were 2 to 6 mm in size. These polyps were removed                            with a cold snare. Resection and retrieval were                            complete.                           Multiple small-mouthed diverticula were found in                            the recto-sigmoid colon and sigmoid colon.                           Normal mucosa was found in the entire colon                            otherwise.                           Non-bleeding non-thrombosed internal hemorrhoids                            were found during retroflexion, during perianal                            exam and during digital exam. The hemorrhoids were                            Grade II (internal hemorrhoids that prolapse but                            reduce spontaneously). Complications:            No immediate complications. Estimated Blood Loss:      Estimated blood loss was minimal. Impression:               - Hemorrhoids found on digital rectal exam.                           - The examined portion of the ileum was normal.                           - Three 2 to 6 mm polyps in the rectum, at the                            recto-sigmoid colon and in the ascending colon,                            removed with a cold snare. Resected and retrieved.                           - Diverticulosis in the recto-sigmoid colon and in  the sigmoid colon.                           - Normal mucosa in the entire examined colon                            otherwise.                           - Non-bleeding non-thrombosed internal hemorrhoids. Recommendation:           - The patient will be observed post-procedure,                            until all discharge criteria are met.                           - Discharge patient to home.                           - Patient has a contact number available for                            emergencies. The signs and symptoms of potential                            delayed complications were discussed with the                            patient. Return to normal activities tomorrow.                            Written discharge instructions were provided to the                            patient.                           - High fiber diet.                           - Use FiberCon 1-2 tablets PO daily.                           - Continue present medications.                           - Await pathology results.                           - Repeat colonoscopy in 3/5/7 years for                            surveillance based on pathology results.                           - The findings and recommendations were discussed  with the patient.                           - The findings and recommendations were discussed                            with the designated  responsible adult. Yong Henle, MD 07/04/2023 10:48:40 AM

## 2023-07-04 NOTE — Patient Instructions (Addendum)
 YOU HAD AN ENDOSCOPIC PROCEDURE TODAY AT THE [facility]:   Refer to the procedure report that was given to you for any specific questions about what was found during the examination.  If the procedure report does not answer your questions, please call your gastroenterologist to clarify.  If you requested that your care partner not be given the details of your procedure findings, then the procedure report has been included in a sealed envelope for you to review at your convenience later.  YOU SHOULD EXPECT: Some feelings of bloating in the abdomen. Passage of more gas than usual.  Walking can help get rid of the air that was put into your GI tract during the procedure and reduce the bloating. If you had a lower endoscopy (such as a colonoscopy or flexible sigmoidoscopy) you may notice spotting of blood in your stool or on the toilet paper. If you underwent a bowel prep for your procedure, you may not have a normal bowel movement for a few days.  Please Note:  You might notice some irritation and congestion in your nose or some drainage.  This is from the oxygen used during your procedure.  There is no need for concern and it should clear up in a day or so.  SYMPTOMS TO REPORT IMMEDIATELY:  Following lower endoscopy (colonoscopy or flexible sigmoidoscopy):  Excessive amounts of blood in the stool  Significant tenderness or worsening of abdominal pains  Swelling of the abdomen that is new, acute  Fever of 100F or higher   For urgent or emergent issues, a gastroenterologist can be reached at any hour by calling [phone]. Do not use MyChart messaging for urgent concerns.    DIET:  We do recommend a small meal at first, but then you may proceed to your regular diet.  Drink plenty of fluids but you should avoid alcoholic beverages for 24 hours. Follow a High Fiber Diet.  MEDICATIONS: Continue present medications. Use Fibercon 1-2 tablets by mouth daily.  FOLLOW UP: Continue  present medications. Repeat colonoscopy in 3/5/7 years for surveillance based on pathology results.  Please see handouts given to you by your recovery nurse: Polyps, Diverticulosis, Hemorrhoids, High Fiber Diet.  Thank you for allowing us  to provide for  your healthcare needs today.  ACTIVITY:  You should plan to take it easy for the rest of today and you should NOT DRIVE or use heavy machinery until tomorrow (because of the sedation medicines used during the test).    FOLLOW UP: Our staff will call the number listed on your records the next business day following your procedure.  We will call around 7:15- 8:00 am to check on you and address any questions or concerns that you may have regarding the information given to you following your procedure. If we do not reach you, we will leave a message.     If any biopsies were taken you will be contacted by phone or by letter within the next 1-3 weeks.  Please call us  at [phone] if you have not heard about the biopsies in 3 weeks.    SIGNATURES/CONFIDENTIALITY: You and/or your care partner have signed paperwork which will be entered into your electronic medical record.  These signatures attest to the fact that that the information above on your After Visit Summary has been reviewed and is understood.  Full responsibility of the confidentiality of this discharge information lies with you and/or your care-partner.

## 2023-07-04 NOTE — Progress Notes (Signed)
 GASTROENTEROLOGY PROCEDURE H&P NOTE   Primary Care Physician: Tisovec, Kristina Pfeiffer, MD  HPI: Joseph Fletcher is a 73 y.o. male who presents for Colonoscopy for surveillance of prior adenomas (last 2018).  Past Medical History:  Diagnosis Date   Arthritis    osteoarthritis- hips, fingers   Cancer (HCC)    basal cell right ear and squamous cell removed on forehead in 2022   Past Surgical History:  Procedure Laterality Date   BACK SURGERY  2024   Dr. Ellery Guthrie- fused L3-L4  (02/2022); L5-S1 disccectomy (03/2023)   COLONOSCOPY  2018   GM-MAC-suprep(exc)-TA x 2, 5 yr recall   JOINT REPLACEMENT Right 2013   RTHA 1'13 -[facility] right hip   TOTAL HIP ARTHROPLASTY Left 01/19/2016   Procedure: LEFT TOTAL HIP ARTHROPLASTY ANTERIOR APPROACH;  Surgeon: Liliane Rei, MD;  Location: [facility];  Service: Orthopedics;  Laterality: Left;   Current Outpatient Medications  Medication Sig Dispense Refill   aspirin EC 81 MG tablet daily.     Cyanocobalamin (VITAMIN B 12 PO) Take 1 tablet by mouth daily.     ibuprofen (ADVIL) 400 MG tablet Take 400 mg by mouth daily at 6 (six) AM.     Multiple Vitamins-Minerals (MENS MULTIVITAMIN PO) Take 1 tablet by mouth daily at 6 (six) AM.     rosuvastatin (CRESTOR) 40 MG tablet Take 40 mg by mouth daily.     tamsulosin (FLOMAX) 0.4 MG CAPS capsule Take 0.4 mg by mouth at bedtime.     gabapentin  (NEURONTIN ) 300 MG capsule Take 300 mg by mouth daily as needed.     VIAGRA 100 MG tablet Take 50 mg by mouth daily as needed for erectile dysfunction.     Current Facility-Administered Medications  Medication Dose Route Frequency Provider Last Rate Last Admin   0.9 %  sodium chloride  infusion  500 mL Intravenous Continuous Janel Medford, MD       0.9 %  sodium chloride  infusion  500 mL Intravenous Once Mansouraty, Shavona Gunderman Jr., MD        Current Outpatient Medications:    aspirin EC 81 MG tablet, daily., Disp: , Rfl:    Cyanocobalamin (VITAMIN B 12 PO), Take 1 tablet by  mouth daily., Disp: , Rfl:    ibuprofen (ADVIL) 400 MG tablet, Take 400 mg by mouth daily at 6 (six) AM., Disp: , Rfl:    Multiple Vitamins-Minerals (MENS MULTIVITAMIN PO), Take 1 tablet by mouth daily at 6 (six) AM., Disp: , Rfl:    rosuvastatin (CRESTOR) 40 MG tablet, Take 40 mg by mouth daily., Disp: , Rfl:    tamsulosin (FLOMAX) 0.4 MG CAPS capsule, Take 0.4 mg by mouth at bedtime., Disp: , Rfl:    gabapentin  (NEURONTIN ) 300 MG capsule, Take 300 mg by mouth daily as needed., Disp: , Rfl:    VIAGRA 100 MG tablet, Take 50 mg by mouth daily as needed for erectile dysfunction., Disp: , Rfl:   Current Facility-Administered Medications:    0.9 %  sodium chloride  infusion, 500 mL, Intravenous, Continuous, Janel Medford, MD   0.9 %  sodium chloride  infusion, 500 mL, Intravenous, Once, Mansouraty, [name], MD No Known Allergies Family History  Problem Relation Age of Onset   Hyperlipidemia Mother    Heart disease Father    Cancer Neg Hx    Stroke Neg Hx    Colon cancer Neg Hx    Colon polyps Neg Hx    Esophageal cancer Neg Hx  Rectal cancer Neg Hx    Stomach cancer Neg Hx    Social History   Socioeconomic History   Marital status: Married    Spouse name: Not on file   Number of children: Not on file   Years of education: Not on file   Highest education level: Not on file  Occupational History   Not on file  Tobacco Use   Smoking status: Never   Smokeless tobacco: Never  Vaping Use   Vaping status: Never Used  Substance and Sexual Activity   Alcohol  use: Yes    Alcohol /week: 1.0 standard drink of alcohol     Types: 1 Glasses of wine per week    Comment: 1 glass weekly   Drug use: No   Sexual activity: Yes  Other Topics Concern   Not on file  Social History Narrative   Not on file   Social Drivers of Health   Financial Resource Strain: Not on file  Food Insecurity: Not on file  Transportation Needs: Not on file  Physical Activity: Not on file  Stress: Not on  file  Social Connections: Not on file  Intimate Partner Violence: Not on file    Physical Exam: Today's Vitals   07/04/23 0902  BP: 122/75  Pulse: (!) 57  Temp: 97.8 F (36.6 C)  TempSrc: Skin  SpO2: 99%  Weight: 166 lb (75.3 kg)  Height: 5\' 9"  (1.753 m)   Body mass index is 24.51 kg/m. GEN: NAD EYE: Sclerae anicteric ENT: MMM CV: Non-tachycardic GI: Soft, NT/ND NEURO:  Alert & Oriented x 3  Lab Results: No results for input(s): "WBC", "HGB", "HCT", "PLT" in the last 72 hours. BMET No results for input(s): "NA", "K", "CL", "CO2", "GLUCOSE", "BUN", "CREATININE", "CALCIUM" in the last 72 hours. LFT No results for input(s): "PROT", "ALBUMIN", "AST", "ALT", "ALKPHOS", "BILITOT", "BILIDIR", "IBILI" in the last 72 hours. PT/INR No results for input(s): "LABPROT", "INR" in the last 72 hours.   Impression / Plan: This is a 73 y.o.male  who presents for Colonoscopy for surveillance of prior adenomas (last 2018).   The risks and benefits of endoscopic evaluation/treatment were discussed with the patient and/or family; these include but are not limited to the risk of perforation, infection, bleeding, missed lesions, lack of diagnosis, severe illness requiring hospitalization, as well as anesthesia and sedation related illnesses.  The patient's history has been reviewed, patient examined, no change in status, and deemed stable for procedure.  The patient and/or family is agreeable to proceed.    Yong Henle, MD [facility]oscopy Office # [phone]

## 2023-07-04 NOTE — Progress Notes (Signed)
 Pt's states no medical or surgical changes since previsit or office visit.

## 2023-07-05 ENCOUNTER — Telehealth: Payer: Self-pay

## 2023-07-05 NOTE — Telephone Encounter (Signed)
  Follow up Call-     07/04/2023    9:03 AM  Call back number  Post procedure Call Back phone  # [phone]  Permission to leave phone message Yes     Patient questions:  Do you have a fever, pain , or abdominal swelling? No. Pain Score  0 *  Have you tolerated food without any problems? Yes.    Have you been able to return to your normal activities? Yes.    Do you have any questions about your discharge instructions: Diet   No. Medications  No. Follow up visit  No.  Do you have questions or concerns about your Care? No.  Actions: * If pain score is 4 or above: No action needed, pain <4.

## 2023-07-06 LAB — SURGICAL PATHOLOGY

## 2023-07-07 ENCOUNTER — Encounter: Payer: Self-pay | Admitting: Gastroenterology

## 2023-07-25 DIAGNOSIS — M4692 Unspecified inflammatory spondylopathy, cervical region: Secondary | ICD-10-CM | POA: Diagnosis not present

## 2023-07-25 DIAGNOSIS — M25512 Pain in left shoulder: Secondary | ICD-10-CM | POA: Diagnosis not present

## 2023-11-26 DIAGNOSIS — I2584 Coronary atherosclerosis due to calcified coronary lesion: Secondary | ICD-10-CM | POA: Diagnosis not present

## 2023-11-26 DIAGNOSIS — E7800 Pure hypercholesterolemia, unspecified: Secondary | ICD-10-CM | POA: Diagnosis not present

## 2023-11-28 DIAGNOSIS — M48062 Spinal stenosis, lumbar region with neurogenic claudication: Secondary | ICD-10-CM | POA: Diagnosis not present

## 2023-11-28 DIAGNOSIS — R82998 Other abnormal findings in urine: Secondary | ICD-10-CM | POA: Diagnosis not present

## 2023-11-28 DIAGNOSIS — M169 Osteoarthritis of hip, unspecified: Secondary | ICD-10-CM | POA: Diagnosis not present

## 2023-11-28 DIAGNOSIS — Z96649 Presence of unspecified artificial hip joint: Secondary | ICD-10-CM | POA: Diagnosis not present

## 2023-11-28 DIAGNOSIS — E7800 Pure hypercholesterolemia, unspecified: Secondary | ICD-10-CM | POA: Diagnosis not present

## 2023-11-28 DIAGNOSIS — Z820 Family history of epilepsy and other diseases of the nervous system: Secondary | ICD-10-CM | POA: Diagnosis not present

## 2023-11-28 DIAGNOSIS — Z8249 Family history of ischemic heart disease and other diseases of the circulatory system: Secondary | ICD-10-CM | POA: Diagnosis not present

## 2023-11-28 DIAGNOSIS — Z0000 Encounter for general adult medical examination without abnormal findings: Secondary | ICD-10-CM | POA: Diagnosis not present

## 2023-11-28 DIAGNOSIS — Z1339 Encounter for screening examination for other mental health and behavioral disorders: Secondary | ICD-10-CM | POA: Diagnosis not present

## 2023-11-28 DIAGNOSIS — Z1331 Encounter for screening for depression: Secondary | ICD-10-CM | POA: Diagnosis not present

## 2023-11-28 DIAGNOSIS — I2510 Atherosclerotic heart disease of native coronary artery without angina pectoris: Secondary | ICD-10-CM | POA: Diagnosis not present

## 2023-11-28 DIAGNOSIS — I2584 Coronary atherosclerosis due to calcified coronary lesion: Secondary | ICD-10-CM | POA: Diagnosis not present

## 2023-11-28 DIAGNOSIS — N401 Enlarged prostate with lower urinary tract symptoms: Secondary | ICD-10-CM | POA: Diagnosis not present

## 2024-02-12 DIAGNOSIS — L820 Inflamed seborrheic keratosis: Secondary | ICD-10-CM | POA: Diagnosis not present

## 2024-02-12 DIAGNOSIS — D225 Melanocytic nevi of trunk: Secondary | ICD-10-CM | POA: Diagnosis not present

## 2024-02-12 DIAGNOSIS — L821 Other seborrheic keratosis: Secondary | ICD-10-CM | POA: Diagnosis not present

## 2024-02-12 DIAGNOSIS — Z85828 Personal history of other malignant neoplasm of skin: Secondary | ICD-10-CM | POA: Diagnosis not present

## 2024-02-12 DIAGNOSIS — D485 Neoplasm of uncertain behavior of skin: Secondary | ICD-10-CM | POA: Diagnosis not present

## 2024-02-12 DIAGNOSIS — D224 Melanocytic nevi of scalp and neck: Secondary | ICD-10-CM | POA: Diagnosis not present
